# Patient Record
Sex: Male | Born: 1960 | Race: White | Hispanic: No | Marital: Married | Smoking: Never smoker
Health system: Southern US, Community
[De-identification: ages and names within clinical notes are randomized; demographics above are authoritative.]

## PROBLEM LIST (undated history)

## (undated) DIAGNOSIS — D126 Benign neoplasm of colon, unspecified: Secondary | ICD-10-CM

## (undated) DIAGNOSIS — G4700 Insomnia, unspecified: Secondary | ICD-10-CM

## (undated) DIAGNOSIS — K429 Umbilical hernia without obstruction or gangrene: Secondary | ICD-10-CM

## (undated) DIAGNOSIS — F419 Anxiety disorder, unspecified: Secondary | ICD-10-CM

## (undated) DIAGNOSIS — L600 Ingrowing nail: Secondary | ICD-10-CM

## (undated) HISTORY — DX: Benign neoplasm of colon, unspecified: D12.6

## (undated) HISTORY — DX: Ingrowing nail: L60.0

## (undated) HISTORY — DX: Insomnia, unspecified: G47.00

## (undated) HISTORY — DX: Umbilical hernia without obstruction or gangrene: K42.9

## (undated) HISTORY — DX: Anxiety disorder, unspecified: F41.9

---

## 1986-06-26 HISTORY — PX: HERNIA REPAIR: SHX51

## 1999-06-27 HISTORY — PX: HERNIA REPAIR: SHX51

## 1999-07-26 ENCOUNTER — Ambulatory Visit (HOSPITAL_BASED_OUTPATIENT_CLINIC_OR_DEPARTMENT_OTHER): Admission: RE | Admit: 1999-07-26 | Discharge: 1999-07-26 | Payer: Self-pay | Admitting: Surgery

## 2004-06-26 HISTORY — PX: HERNIA REPAIR: SHX51

## 2005-03-16 ENCOUNTER — Ambulatory Visit (HOSPITAL_COMMUNITY): Admission: RE | Admit: 2005-03-16 | Discharge: 2005-03-16 | Payer: Self-pay | Admitting: Surgery

## 2006-03-26 ENCOUNTER — Encounter: Admission: RE | Admit: 2006-03-26 | Discharge: 2006-03-26 | Payer: Self-pay | Admitting: Emergency Medicine

## 2006-06-09 ENCOUNTER — Encounter: Admission: RE | Admit: 2006-06-09 | Discharge: 2006-06-09 | Payer: Self-pay | Admitting: Emergency Medicine

## 2008-07-29 ENCOUNTER — Encounter: Admission: RE | Admit: 2008-07-29 | Discharge: 2008-07-29 | Payer: Self-pay | Admitting: Surgery

## 2010-11-11 NOTE — Op Note (Signed)
Ronald Cochran, Ronald Cochran                ACCOUNT NO.:  [id number]   MEDICAL RECORD NO.:  [id number]          PATIENT TYPE:  AMB   LOCATION:  DAY                          FACILITY:  [facility]   PHYSICIAN:  Wilmon Arms. Corliss Skains, M.D. DATE OF BIRTH:  09-27-1960   DATE OF PROCEDURE:  03/16/2005  DATE OF DISCHARGE:                                 OPERATIVE REPORT   PREOPERATIVE DIAGNOSIS:  Umbilical hernia.   POSTOPERATIVE DIAGNOSIS:  Umbilical hernia.   PROCEDURE PERFORMED:  Umbilical hernia repair.   SURGEON:  Wilmon Arms. Corliss Skains, M.D.   ASSISTANT:  Adolph Pollack, M.D.   ANESTHESIA:  General endotracheal.   INDICATIONS:  Patient is a 50 year old male in excellent health who has had  two previous inguinal hernia repairs.  The patient has noticed some  umbilical discomfort and a bulge.  On examination, he was noted to have a  herniated defect with some incarcerated omentum.  The decision was made to  proceed with the repair.   DESCRIPTION OF PROCEDURE:  Patient was brought to the operating room and  placed in a supine position on the operating room table.  He had been given  preoperative antibiotics.  After an adequate level of general endotracheal  anesthesia was obtained, the patient's periumbilical area was shaved,  prepped with Betadine, and draped in a sterile fashion.  A time out was then  taken to insure the proper patient and proper procedure.  A 3 cm incision  was made just above the umbilicus.  Dissection was carried down through the  subcutaneous tissues with Bovie cautery.  The hernia sac was identified.  This was densely adherent to the base of the umbilicus.  This was dissected  away from the undersurface of the umbilicus down to the fascia.  The fascial  defect was less than 1 cm in size.  The hernia sac was opened and appeared  to contain only a small piece of omentum.  This was reduced down to the  peritoneal cavity.  The sac was mobilized for approximately 2 cm in all  directions.  The decision was made to do a primary repair with suture.  A 0  Surgilon suture was then used to close the defect.  Four interrupted sutures  were used.  The wound was thoroughly irrigated.  A total of 16 cc of 0.25%  Marcaine were used to infiltrate the area, 3-0 Vicryl was used to tack down  the stalk of the umbilicus to the fascia, and 4-0 Monocryl was used to close  the skin.  Steri-Strips and a clean dressing were applied.  The patient was  extubated and brought to the recovery room in stable condition.      Wilmon Arms. Tsuei, M.D.  Electronically Signed     MKT/MEDQ  D:  03/16/2005  T:  03/16/2005  Job:  [id number]

## 2011-06-16 ENCOUNTER — Ambulatory Visit (HOSPITAL_COMMUNITY)
Admission: RE | Admit: 2011-06-16 | Discharge: 2011-06-16 | Disposition: A | Discharge status: Home / Self Care | Payer: BC Managed Care – PPO | Source: Ambulatory Visit | Attending: Family Medicine | Admitting: Family Medicine

## 2011-06-16 ENCOUNTER — Other Ambulatory Visit (HOSPITAL_COMMUNITY): Payer: Self-pay | Admitting: Family Medicine

## 2011-06-16 DIAGNOSIS — R1031 Right lower quadrant pain: Secondary | ICD-10-CM | POA: Insufficient documentation

## 2011-06-16 DIAGNOSIS — K37 Unspecified appendicitis: Secondary | ICD-10-CM

## 2011-06-16 DIAGNOSIS — K429 Umbilical hernia without obstruction or gangrene: Secondary | ICD-10-CM | POA: Insufficient documentation

## 2011-06-16 MED ORDER — IOHEXOL 300 MG/ML  SOLN
100.0000 mL | Freq: Once | INTRAMUSCULAR | Status: AC | PRN
  Administered 2011-06-16: 100 mL via INTRAVENOUS

## 2011-09-26 ENCOUNTER — Encounter (INDEPENDENT_AMBULATORY_CARE_PROVIDER_SITE_OTHER): Payer: Self-pay | Admitting: Surgery

## 2011-10-17 ENCOUNTER — Encounter (INDEPENDENT_AMBULATORY_CARE_PROVIDER_SITE_OTHER): Payer: Self-pay | Admitting: Surgery

## 2011-10-19 ENCOUNTER — Encounter (INDEPENDENT_AMBULATORY_CARE_PROVIDER_SITE_OTHER): Payer: Self-pay | Admitting: Surgery

## 2011-10-19 ENCOUNTER — Ambulatory Visit (INDEPENDENT_AMBULATORY_CARE_PROVIDER_SITE_OTHER): Payer: BC Managed Care – PPO | Admitting: Surgery

## 2011-10-19 VITALS — BP 118/80 | HR 64 | Temp 98.0°F | Resp 16 | Ht 69.0 in | Wt 199.0 lb

## 2011-10-19 DIAGNOSIS — K429 Umbilical hernia without obstruction or gangrene: Secondary | ICD-10-CM | POA: Insufficient documentation

## 2011-10-19 NOTE — Progress Notes (Signed)
Patient ID: Ronald Cochran, male   DOB: 1960/10/20, 51 y.o.   MRN: [id number]  Chief Complaint  Patient presents with  . Umbilical Hernia    HPI Ronald Cochran is a 51 y.o. male.  Referred by Dr. Docia Chuck HPI 51 yo male s/p open bilateral inguinal hernia repairs in the past, also s/p umbilical hernia repair in 2006.  He had a suture repair of a very small hernia defect.  Last December, he had an episode of severe umbilical pain and swelling.   He had a CT scan that showed an umbilical hernia containing fat.  The hernia was reduced and the patient only has intermittent discomfort at this time. Past Medical History  Diagnosis Date  . Anxiety   . Insomnia   . Tubular adenoma of colon   . Ingrowing nail     Past Surgical History  Procedure Date  . Umbilical hernia repair   . Hernia repair 1988    right inguinal hernia  . Hernia repair 2001    left inguinal hernia  . Hernia repair 2006    umbilical hernia    Family History  Problem Relation Age of Onset  . Heart disease Father   . Cancer Maternal Aunt     ovarian  . Cancer Maternal Grandmother     ovarian    Social History History  Substance Use Topics  . Smoking status: Never Smoker   . Smokeless tobacco: Not on file  . Alcohol Use: Yes    No Known Allergies  Current Outpatient Prescriptions  Medication Sig Dispense Refill  . fish oil-omega-3 fatty acids 1000 MG capsule Take 2 g by mouth daily.      Marland Kitchen loratadine (CLARITIN) 10 MG tablet Take 10 mg by mouth daily.      . Multiple Vitamins-Minerals (MENS MULTI VITAMIN & MINERAL PO) Take by mouth.        Review of Systems Review of Systems  Constitutional: Negative for fever, chills and unexpected weight change.  HENT: Negative for hearing loss, congestion, sore throat, trouble swallowing and voice change.   Eyes: Negative for visual disturbance.  Respiratory: Negative for cough and wheezing.   Cardiovascular: Negative for chest pain, palpitations and leg swelling.    Gastrointestinal: Positive for abdominal pain. Negative for nausea, vomiting, diarrhea, constipation, blood in stool, abdominal distention, anal bleeding and rectal pain.  Genitourinary: Negative for hematuria and difficulty urinating.  Musculoskeletal: Negative for arthralgias.  Skin: Negative for rash and wound.  Neurological: Negative for seizures, syncope, weakness and headaches.  Hematological: Negative for adenopathy. Does not bruise/bleed easily.  Psychiatric/Behavioral: Negative for confusion.    Blood pressure 118/80, pulse 64, temperature 98 F (36.7 C), temperature source Temporal, resp. rate 16, height 5\' 9"  (1.753 m), weight 199 lb (90.266 kg).  Physical Exam Physical Exam WDWN in NAD HEENT:  EOMI, sclera anicteric Neck:  No masses, no thyromegaly Lungs:  CTA bilaterally; normal respiratory effort CV:  Regular rate and rhythm; no murmurs Abd:  +bowel sounds, soft, non-tender, small protruding, partially reducible hernia at lower umbilicus GU:  No sign of recurrent hernia on either side Ext:  Well-perfused; no edema Skin:  Warm, dry; no sign of jaundice  Data Reviewed CT scan  Assessment    Recurrent umbilical hernia    Plan    Umbilical hernia repair with mesh.  The surgical procedure has been discussed with the patient.  Potential risks, benefits, alternative treatments, and expected outcomes have been explained.  All of  the patient's questions at this time have been answered.  The likelihood of reaching the patient's treatment goal is good.  The patient understand the proposed surgical procedure and wishes to proceed.        Amberlee Garvey K. 10/19/2011, 9:48 AM

## 2011-12-06 DIAGNOSIS — K429 Umbilical hernia without obstruction or gangrene: Secondary | ICD-10-CM

## 2011-12-06 HISTORY — PX: UMBILICAL HERNIA REPAIR: SHX196

## 2011-12-07 ENCOUNTER — Telehealth (INDEPENDENT_AMBULATORY_CARE_PROVIDER_SITE_OTHER): Payer: Self-pay | Admitting: General Surgery

## 2011-12-07 NOTE — Telephone Encounter (Signed)
MR Cass CALLED TO ASK IF IT WOULD BE OK FOR HIM TO SEE HIS CHIROPRACTOR NEXT WEEK AND HAVE A BACK MASSAGE. HE WOULD BE PLACED ON HIS ABDOMEN. I REVIEWED THIS WITH DR. Corliss Skains AND HE SAID IT WOULD BE OK/ PT NOTIFIED/GY/ I ALSO TOLD PT IT WOULD DEPEND ON HIS COMFORT LEVEL WHEN ON ABDOMEN/GY

## 2012-01-09 ENCOUNTER — Encounter (INDEPENDENT_AMBULATORY_CARE_PROVIDER_SITE_OTHER): Payer: Self-pay | Admitting: Surgery

## 2012-01-09 ENCOUNTER — Ambulatory Visit (INDEPENDENT_AMBULATORY_CARE_PROVIDER_SITE_OTHER): Payer: BC Managed Care – PPO | Admitting: Surgery

## 2012-01-09 VITALS — BP 128/80 | HR 70 | Temp 97.4°F | Resp 16 | Ht 69.0 in | Wt 196.2 lb

## 2012-01-09 DIAGNOSIS — K429 Umbilical hernia without obstruction or gangrene: Secondary | ICD-10-CM

## 2012-01-09 NOTE — Progress Notes (Signed)
S/p open repair of an umbilical hernia with mesh on 12/06/11.  The patient is doing quite well.  He recently had his wisdom teeth removed, which distracted him from any umbilical pain.  His incision is well-healed with no sign of infection or recurrent hernia.  He may resume full activity.  Follow-up PRN.  Wilmon Arms. Corliss Skains, MD, [facility]  01/09/2012 9:44 AM

## 2012-06-03 ENCOUNTER — Ambulatory Visit
Admission: RE | Admit: 2012-06-03 | Discharge: 2012-06-03 | Disposition: A | Discharge status: Home / Self Care | Payer: BC Managed Care – PPO | Source: Ambulatory Visit | Attending: Family Medicine | Admitting: Family Medicine

## 2012-06-03 ENCOUNTER — Other Ambulatory Visit: Payer: Self-pay | Admitting: Family Medicine

## 2012-06-03 DIAGNOSIS — M25472 Effusion, left ankle: Secondary | ICD-10-CM

## 2013-01-13 ENCOUNTER — Encounter (INDEPENDENT_AMBULATORY_CARE_PROVIDER_SITE_OTHER): Payer: Self-pay | Admitting: Surgery

## 2013-01-17 ENCOUNTER — Encounter (INDEPENDENT_AMBULATORY_CARE_PROVIDER_SITE_OTHER): Payer: Self-pay | Admitting: General Surgery

## 2013-01-17 ENCOUNTER — Ambulatory Visit (INDEPENDENT_AMBULATORY_CARE_PROVIDER_SITE_OTHER): Payer: BC Managed Care – PPO | Admitting: General Surgery

## 2013-01-17 ENCOUNTER — Other Ambulatory Visit (HOSPITAL_COMMUNITY): Payer: Self-pay | Admitting: Chiropractic Medicine

## 2013-01-17 ENCOUNTER — Ambulatory Visit (HOSPITAL_COMMUNITY)
Admission: RE | Admit: 2013-01-17 | Discharge: 2013-01-17 | Disposition: A | Discharge status: Home / Self Care | Payer: BC Managed Care – PPO | Source: Ambulatory Visit | Attending: Chiropractic Medicine | Admitting: Chiropractic Medicine

## 2013-01-17 VITALS — BP 132/80 | HR 56 | Resp 14 | Ht 69.0 in | Wt 188.6 lb

## 2013-01-17 DIAGNOSIS — M542 Cervicalgia: Secondary | ICD-10-CM

## 2013-01-17 DIAGNOSIS — D179 Benign lipomatous neoplasm, unspecified: Secondary | ICD-10-CM

## 2013-01-17 DIAGNOSIS — M25512 Pain in left shoulder: Secondary | ICD-10-CM

## 2013-01-17 DIAGNOSIS — M25519 Pain in unspecified shoulder: Secondary | ICD-10-CM | POA: Insufficient documentation

## 2013-01-17 NOTE — Progress Notes (Signed)
Patient ID: Ronald Cochran, male   DOB: 06/15/1961, 52 y.o.   MRN: [id number]  Chief Complaint  Patient presents with  . New Evaluation    eval lipoma on back    HPI Ronald Cochran is a 52 y.o. male.  The patient is a 52 year old male who is referred by Dr. Docia Chuck for an evaluation of the left shoulder lipoma. They state this year for many years but has gotten bigger and causing more pain. The bases of these had some neuropathic pain that radiates to his left chest wall. HPI  Past Medical History  Diagnosis Date  . Anxiety   . Insomnia   . Tubular adenoma of colon   . Ingrowing nail   . Umbilical hernia     Past Surgical History  Procedure Laterality Date  . Umbilical hernia repair  12/06/11    with mesh  . Hernia repair  1988    right inguinal hernia  . Hernia repair  2001    left inguinal hernia  . Hernia repair  2006    umbilical hernia    Family History  Problem Relation Age of Onset  . Heart disease Father   . Cancer Maternal Aunt     ovarian  . Cancer Maternal Grandmother     ovarian    Social History History  Substance Use Topics  . Smoking status: Never Smoker   . Smokeless tobacco: Not on file  . Alcohol Use: Yes    No Known Allergies  Current Outpatient Prescriptions  Medication Sig Dispense Refill  . fish oil-omega-3 fatty acids 1000 MG capsule Take 2 g by mouth daily.      Marland Kitchen loratadine (CLARITIN) 10 MG tablet Take 10 mg by mouth as needed.       . Multiple Vitamins-Minerals (MENS MULTI VITAMIN & MINERAL PO) Take by mouth.       No current facility-administered medications for this visit.    Review of Systems Review of Systems  Constitutional: Negative.   Eyes: Negative.   Respiratory: Negative.   Cardiovascular: Negative.   Gastrointestinal: Negative.   Neurological: Negative.   All other systems reviewed and are negative.    Blood pressure 132/80, pulse 56, resp. rate 14, height 5\' 9"  (1.753 m), weight 188 lb 9.6 oz (85.548  kg).  Physical Exam Physical Exam  Constitutional: He is oriented to person, place, and time. He appears well-developed and well-nourished.  HENT:  Head: Normocephalic and atraumatic.  Eyes: Conjunctivae and EOM are normal. Pupils are equal, round, and reactive to light.  Neck: Normal range of motion. Neck supple.  Cardiovascular: Normal rate, regular rhythm and normal heart sounds.   Pulmonary/Chest: Effort normal and breath sounds normal.    Abdominal: Bowel sounds are normal.  Musculoskeletal: Normal range of motion.  Neurological: He is alert and oriented to person, place, and time.    Data Reviewed none  Assessment    This 52 year old male with a left posterior shoulder lipoma     Plan    1. We'll proceed to the operating room for an excision of the left posterior shoulder lipoma. 2. Discussed with him the possible risks of surgery to include infection, bleeding, damage to other structures, possible recurrence, and possible seroma formation. The patient voiced understanding and wished to proceed with the procedure.       Marigene Ehlers., Montoya Watkin 01/17/2013, 2:37 PM

## 2013-01-20 ENCOUNTER — Encounter (INDEPENDENT_AMBULATORY_CARE_PROVIDER_SITE_OTHER): Payer: BC Managed Care – PPO | Admitting: General Surgery

## 2013-02-04 ENCOUNTER — Encounter (INDEPENDENT_AMBULATORY_CARE_PROVIDER_SITE_OTHER): Payer: BC Managed Care – PPO | Admitting: Surgery

## 2013-02-12 ENCOUNTER — Other Ambulatory Visit (INDEPENDENT_AMBULATORY_CARE_PROVIDER_SITE_OTHER): Payer: Self-pay | Admitting: *Deleted

## 2013-02-12 ENCOUNTER — Other Ambulatory Visit (INDEPENDENT_AMBULATORY_CARE_PROVIDER_SITE_OTHER): Payer: Self-pay | Admitting: General Surgery

## 2013-02-12 DIAGNOSIS — D1739 Benign lipomatous neoplasm of skin and subcutaneous tissue of other sites: Secondary | ICD-10-CM

## 2013-02-12 MED ORDER — OXYCODONE-ACETAMINOPHEN 5-325 MG PO TABS: 1.0000 | ORAL_TABLET | ORAL | Status: DC | PRN

## 2013-02-26 ENCOUNTER — Telehealth (INDEPENDENT_AMBULATORY_CARE_PROVIDER_SITE_OTHER): Payer: Self-pay | Admitting: General Surgery

## 2013-02-26 NOTE — Telephone Encounter (Signed)
LMOM @10 :45 asking patient to be here at 3:00 for Friday 9/5 appt...pt suppose to call back to let us know if 3 is a good time.Marland KitchenMarland KitchenIf not then 4:30 is his arrival time

## 2013-02-28 ENCOUNTER — Encounter (INDEPENDENT_AMBULATORY_CARE_PROVIDER_SITE_OTHER): Payer: Self-pay | Admitting: General Surgery

## 2013-02-28 ENCOUNTER — Ambulatory Visit (INDEPENDENT_AMBULATORY_CARE_PROVIDER_SITE_OTHER): Payer: BC Managed Care – PPO | Admitting: General Surgery

## 2013-02-28 VITALS — BP 112/68 | HR 62 | Resp 14 | Ht 69.0 in | Wt 190.6 lb

## 2013-02-28 DIAGNOSIS — Z86018 Personal history of other benign neoplasm: Secondary | ICD-10-CM

## 2013-02-28 DIAGNOSIS — Z9889 Other specified postprocedural states: Secondary | ICD-10-CM

## 2013-02-28 NOTE — Progress Notes (Signed)
Patient ID: Ronald Cochran, male   DOB: Apr 03, 1961, 52 y.o.   MRN: [id number] The patient is a 52 year old male status post left scapular lipoma removal. The patient has been doing fine up until yesterday when he noticed a bump return underneath the incision site. The patient states he is started weightlifting and some burning. The patient does state that it was slightly tender in that area. The patient had no signs or symptoms of infection or fever.  On exam: Patient has a well-healed incision. Fluctuant fluid collection underneath the incision site.  Assessment and plan: 1. I proceeded to aspirate the fluid collection which turned out to be a hematoma. Approximately 25 cc were removed.  His stitcheswere removed. I discussed with the patient the signs and symptoms of possible infection to area he calls back any erythema or increased pain come up. 2. The patient follow up as needed.

## 2013-03-04 ENCOUNTER — Ambulatory Visit (INDEPENDENT_AMBULATORY_CARE_PROVIDER_SITE_OTHER): Payer: BC Managed Care – PPO | Admitting: Surgery

## 2013-03-04 ENCOUNTER — Encounter (INDEPENDENT_AMBULATORY_CARE_PROVIDER_SITE_OTHER): Payer: Self-pay | Admitting: Surgery

## 2013-03-04 VITALS — BP 116/70 | HR 64 | Temp 98.0°F | Resp 14 | Ht 69.0 in | Wt 190.8 lb

## 2013-03-04 DIAGNOSIS — IMO0002 Reserved for concepts with insufficient information to code with codable children: Secondary | ICD-10-CM | POA: Insufficient documentation

## 2013-03-04 DIAGNOSIS — T889XXS Complication of surgical and medical care, unspecified, sequela: Secondary | ICD-10-CM

## 2013-03-04 DIAGNOSIS — T888XXS Other specified complications of surgical and medical care, not elsewhere classified, sequela: Secondary | ICD-10-CM

## 2013-03-04 DIAGNOSIS — D171 Benign lipomatous neoplasm of skin and subcutaneous tissue of trunk: Secondary | ICD-10-CM | POA: Insufficient documentation

## 2013-03-04 DIAGNOSIS — D1779 Benign lipomatous neoplasm of other sites: Secondary | ICD-10-CM

## 2013-03-04 NOTE — Progress Notes (Signed)
URGENT OFFICE  The patient is status post excision of a left shoulder lipoma on 02/12/13. This was performed by Dr. Derrell Lolling. This lipoma was about 8 cm across. He did not have a drain in place. About 2 weeks after surgery the patient developed swelling under his incision. This area was aspirated 4 days ago by Dr. Derrell Lolling. Approximately 25 cc of old bloody fluid was aspirated. The patient states that he felt much better. However over the last few days this seems to have reaccumulated and is fairly sore.  On examination there is no sign of infection. His incision is well-healed. He does seem to have some fluid under his wound. The wound is not tense. We prepped the skin with alcohol and anesthetized with 1% lidocaine. I aspirated about 8 cc of old bloody fluid from the lipoma cavity. We placed a Band-Aid over the puncture site. The patient will keep ice pack over this area for the next couple of days and we'll decrease his workout regimen that involves his shoulders. We will have him follow up with Dr. Derrell Lolling in one week.  Ronald Cochran. Corliss Skains, MD, [facility]  General/ Trauma Surgery  03/04/2013 3:30 PM

## 2013-03-11 ENCOUNTER — Encounter (INDEPENDENT_AMBULATORY_CARE_PROVIDER_SITE_OTHER): Payer: Self-pay | Admitting: General Surgery

## 2013-03-11 ENCOUNTER — Ambulatory Visit (INDEPENDENT_AMBULATORY_CARE_PROVIDER_SITE_OTHER): Payer: BC Managed Care – PPO | Admitting: General Surgery

## 2013-03-11 VITALS — BP 118/74 | HR 68 | Resp 16 | Ht 69.0 in | Wt 192.6 lb

## 2013-03-11 DIAGNOSIS — Z9889 Other specified postprocedural states: Secondary | ICD-10-CM

## 2013-03-11 NOTE — Progress Notes (Signed)
Patient ID: Ronald Cochran, male   DOB: 1960-11-07, 52 y.o.   MRN: [id number]  The patient is a 52 year old male status post left lipoma excision. The patient has had some liquefied hematoma that has been aspirated x2. The patient complaints of some pain in the inferior portion of his wound.  On exam: His wound is clean dry and intact. He does have a small amount of fluid in the inferior portion of the wound. There is a minimal bruising in the superior portion of the wound that seems superficial.  Assessment and plan: 1. We'll have patient follow back up in 2 weeks 4 wound check. 2. The patient in followup prior to these had any further issues.

## 2013-03-28 ENCOUNTER — Encounter (INDEPENDENT_AMBULATORY_CARE_PROVIDER_SITE_OTHER): Payer: BC Managed Care – PPO | Admitting: General Surgery

## 2013-04-01 ENCOUNTER — Encounter (INDEPENDENT_AMBULATORY_CARE_PROVIDER_SITE_OTHER): Payer: Self-pay | Admitting: General Surgery

## 2013-04-01 ENCOUNTER — Ambulatory Visit (INDEPENDENT_AMBULATORY_CARE_PROVIDER_SITE_OTHER): Payer: BC Managed Care – PPO | Admitting: General Surgery

## 2013-04-01 VITALS — BP 126/76 | HR 68 | Resp 18 | Ht 69.0 in | Wt 193.6 lb

## 2013-04-01 DIAGNOSIS — Z9889 Other specified postprocedural states: Secondary | ICD-10-CM

## 2013-04-01 NOTE — Progress Notes (Signed)
Patient ID: Ronald Cochran, male   DOB: 11-09-1960, 52 y.o.   MRN: [id number] The patient is a 52 year old male status post back lipoma removal. The patient has been doing well since his last aspiration of this liquefied hematoma. He has no pain at this time.  On exam: wound is clean dry and intact  There is a small mobile Intact hematoma that feels approximately jellybean in size be incised.  The patient is a 52 year old male status post lipoma removal and hematoma. Patient in followup as needed

## 2013-05-01 ENCOUNTER — Other Ambulatory Visit: Payer: Self-pay

## 2013-08-05 ENCOUNTER — Ambulatory Visit (HOSPITAL_BASED_OUTPATIENT_CLINIC_OR_DEPARTMENT_OTHER): Payer: BC Managed Care – PPO | Attending: Otolaryngology

## 2013-08-05 VITALS — Ht 69.0 in | Wt 193.0 lb

## 2013-08-05 DIAGNOSIS — Z9989 Dependence on other enabling machines and devices: Secondary | ICD-10-CM

## 2013-08-05 DIAGNOSIS — G4733 Obstructive sleep apnea (adult) (pediatric): Secondary | ICD-10-CM

## 2013-08-09 DIAGNOSIS — G4733 Obstructive sleep apnea (adult) (pediatric): Secondary | ICD-10-CM

## 2013-08-09 NOTE — Sleep Study (Signed)
   NAME: Ronald Cochran DATE OF BIRTH:  1961-05-21 MEDICAL RECORD NUMBER [id number]  LOCATION: [facility]  PHYSICIAN: YOUNG,CLINTON D  DATE OF STUDY: 08/05/2013  SLEEP STUDY TYPE: Nocturnal Polysomnogram               REFERRING PHYSICIAN: Melida Quitter, MD  INDICATION FOR STUDY: Hypersomnia with sleep apnea  EPWORTH SLEEPINESS SCORE:   9/24 HEIGHT: 5\' 9"  (175.3 cm)  WEIGHT: 193 lb (87.544 kg)    Body mass index is 28.49 kg/(m^2).  NECK SIZE: 16.5 in.  MEDICATIONS: Charted for review  SLEEP ARCHITECTURE: Split study protocol. During the diagnostic phase, total sleep time 120.5 minutes with sleep efficiency 88%. Stage I was 8.7%, stage II 75.5%, stage III 0.8%, REM 14.9% of total sleep time. Sleep latency 10 minutes, REM latency 56.5 minutes, awake after sleep onset 6.5 minutes, arousal index 21.4. Bedtime medication: None.  RESPIRATORY DATA: Apnea hypopnea index (AHI) 25.4 per hour. 51 events scored including 6 obstructive apneas and 45 hypopneas. Events were not positional. REM AHI 20 per hour. CPAP titrated to 7 CWP, AHI 0 per hour. He wore a [employer] Simplus FFM, size large, with heated humidifier.  OXYGEN DATA: Moderately loud snoring before CPAP with oxygen desaturation to a nadir of 87% on room air. With CPAP control, snoring was prevented and mean oxygen saturation of 93.8% on room air.  CARDIAC DATA: Normal sinus rhythm  MOVEMENT/PARASOMNIA: A few incidental limb jerks were noted with little effect on sleep. Bathroom x1  IMPRESSION/ RECOMMENDATION:   1) Moderate obstructive sleep apnea/hypopnea syndrome, AHI 25.4 per hour with non-positional events. Moderately loud snoring with oxygen desaturation to a nadir of 87% on room air.  2) Successful CPAP titration to 7 CWP, AHI 0 per hour. He wore a large [employer] Simplus mask with heated humidifier. Snoring was prevented and mean oxygen saturation held at 93.8% on room air   Signed Baird Lyons  M.D. Deneise Lever Diplomate, American Board of Sleep Medicine  ELECTRONICALLY SIGNED ON:  08/09/2013, 11:40 AM [place] PH: [phone]   FX: [phone] [place]

## 2013-12-06 ENCOUNTER — Emergency Department (HOSPITAL_COMMUNITY)
Admission: EM | Admit: 2013-12-06 | Discharge: 2013-12-06 | Disposition: A | Discharge status: Home / Self Care | Payer: BC Managed Care – PPO | Attending: Emergency Medicine | Admitting: Emergency Medicine

## 2013-12-06 ENCOUNTER — Encounter (HOSPITAL_COMMUNITY): Payer: Self-pay | Admitting: Emergency Medicine

## 2013-12-06 ENCOUNTER — Emergency Department (HOSPITAL_COMMUNITY): Payer: BC Managed Care – PPO

## 2013-12-06 DIAGNOSIS — Y92009 Unspecified place in unspecified non-institutional (private) residence as the place of occurrence of the external cause: Secondary | ICD-10-CM | POA: Insufficient documentation

## 2013-12-06 DIAGNOSIS — W260XXA Contact with knife, initial encounter: Secondary | ICD-10-CM | POA: Insufficient documentation

## 2013-12-06 DIAGNOSIS — Y9389 Activity, other specified: Secondary | ICD-10-CM | POA: Insufficient documentation

## 2013-12-06 DIAGNOSIS — Z23 Encounter for immunization: Secondary | ICD-10-CM | POA: Insufficient documentation

## 2013-12-06 DIAGNOSIS — Z79899 Other long term (current) drug therapy: Secondary | ICD-10-CM | POA: Insufficient documentation

## 2013-12-06 DIAGNOSIS — Z8659 Personal history of other mental and behavioral disorders: Secondary | ICD-10-CM | POA: Insufficient documentation

## 2013-12-06 DIAGNOSIS — W261XXA Contact with sword or dagger, initial encounter: Secondary | ICD-10-CM

## 2013-12-06 DIAGNOSIS — IMO0002 Reserved for concepts with insufficient information to code with codable children: Secondary | ICD-10-CM | POA: Insufficient documentation

## 2013-12-06 DIAGNOSIS — Z872 Personal history of diseases of the skin and subcutaneous tissue: Secondary | ICD-10-CM | POA: Insufficient documentation

## 2013-12-06 DIAGNOSIS — Z8719 Personal history of other diseases of the digestive system: Secondary | ICD-10-CM | POA: Insufficient documentation

## 2013-12-06 DIAGNOSIS — Z791 Long term (current) use of non-steroidal anti-inflammatories (NSAID): Secondary | ICD-10-CM | POA: Insufficient documentation

## 2013-12-06 DIAGNOSIS — S6980XA Other specified injuries of unspecified wrist, hand and finger(s), initial encounter: Secondary | ICD-10-CM | POA: Insufficient documentation

## 2013-12-06 DIAGNOSIS — S6990XA Unspecified injury of unspecified wrist, hand and finger(s), initial encounter: Principal | ICD-10-CM | POA: Insufficient documentation

## 2013-12-06 DIAGNOSIS — S6992XA Unspecified injury of left wrist, hand and finger(s), initial encounter: Secondary | ICD-10-CM

## 2013-12-06 MED ORDER — HYDROCODONE-ACETAMINOPHEN 5-325 MG PO TABS: 1.0000 | ORAL_TABLET | ORAL | Status: AC | PRN

## 2013-12-06 MED ORDER — TETANUS-DIPHTH-ACELL PERTUSSIS 5-2.5-18.5 LF-MCG/0.5 IM SUSP
0.5000 mL | Freq: Once | INTRAMUSCULAR | Status: AC
  Administered 2013-12-06: 0.5 mL via INTRAMUSCULAR
  Filled 2013-12-06: qty 0.5

## 2013-12-06 MED ORDER — OXYCODONE-ACETAMINOPHEN 5-325 MG PO TABS
1.0000 | ORAL_TABLET | Freq: Once | ORAL | Status: AC
  Administered 2013-12-06: 1 via ORAL
  Filled 2013-12-06: qty 1

## 2013-12-06 MED ORDER — GELATIN ABSORBABLE 12-7 MM EX MISC
1.0000 | Freq: Once | CUTANEOUS | Status: AC
  Administered 2013-12-06: 1 via TOPICAL

## 2013-12-06 MED ORDER — IBUPROFEN 800 MG PO TABS: 800.0000 mg | ORAL_TABLET | Freq: Three times a day (TID) | ORAL | Status: AC

## 2013-12-06 NOTE — ED Notes (Signed)
Patient returned from X-ray 

## 2013-12-06 NOTE — ED Notes (Signed)
See Wound Note.

## 2013-12-06 NOTE — ED Notes (Addendum)
Pt was cooking, cutting vegetables, cut nail bed of right pinky finger. Clotted, not bleeding at this time. Came immediately to ED. Tingling/numbness to site

## 2013-12-06 NOTE — Discharge Instructions (Signed)
Call for a follow up appointment with a Family or Primary Care Provider.  Return if Symptoms worsen.   Take medication as prescribed.  Keep hand elevated above your heart. Ice your finger. Change your dressing in 1 day.  The Surgifoam should absorb over night. Keep the wound clean and dry.    Emergency Department Resource Guide 1) Find a Doctor and Pay Out of Pocket Although you won't have to find out who is covered by your insurance plan, it is a good idea to ask around and get recommendations. You will then need to call the office and see if the doctor you have chosen will accept you as a new patient and what types of options they offer for patients who are self-pay. Some doctors offer discounts or will set up payment plans for their patients who do not have insurance, but you will need to ask so you aren't surprised when you get to your appointment.  2) Contact Your Local Health Department Not all health departments have doctors that can see patients for sick visits, but many do, so it is worth a call to see if yours does. If you don't know where your local health department is, you can check in your phone book. The CDC also has a tool to help you locate your state's health department, and many state websites also have listings of all of their local health departments.  3) Find a [facility] If your illness is not likely to be very severe or complicated, you may want to try a walk in clinic. These are popping up all over the country in pharmacies, drugstores, and shopping centers. They're usually staffed by nurse practitioners or physician assistants that have been trained to treat common illnesses and complaints. They're usually fairly quick and inexpensive. However, if you have serious medical issues or chronic medical problems, these are probably not your best option.  No Primary Care Doctor: - Call Health Connect at  [phone] - they can help you locate a primary care doctor that  accepts  your insurance, provides certain services, etc. - Physician Referral Service- [phone]  Chronic Pain Problems: Organization         Address  Phone   Notes  [facility]  [phone] Patients need to be referred by their primary care doctor.   Medication Assistance: Organization         Address  Phone   Notes  [facility] Medication [facility] [place] [place], [place] [address] [phone] --Must be a resident of [facility] -- Must have NO insurance coverage whatsoever (no Medicaid/ Medicare, etc.) -- The pt. MUST have a primary care doctor that directs their care regularly and follows them in the community   MedAssist  [phone]   [employer]  [phone]    Agencies that provide inexpensive medical care: Organization         Address  Phone   Notes  [place]  [phone]   [facility]    [phone]   [facility] [place], [place] [address] [phone]   [place] [address], [place] [phone]   Planned Parenthood    [phone]   [facility]    [phone]   [place] and [place], [place] Phone:  [phone], Fax:  [phone] Hours  of Operation:  9 am - 6 pm, M-F.  Also accepts Medicaid/Medicare and self-pay.  [facility] for [place], [address], [place] Phone: [phone], Fax: [phone]. Hours of Operation:  8:30 am - 5:30 pm, M-F.  Also accepts Medicaid and self-pay.  [facility] [place] [address], [place] Phone: [phone]   [place], [place], [place] [phone], Ext. [phone] Mondays & Thursdays: 7-9 AM.  First 15 patients are seen on a first come, first serve basis.    [place] Providers:  Organization          Address  Phone   Notes  [facility] [address],  [phone] Also accepts self-pay patients.  [facility] [address], [place]  [phone]   [place], [address], [place] [phone]   [facility] Family Medicine [address] [place] [phone]   Lucianne Lei [address], [place]   [phone] Only accepts [place] Access [place] patients after they have their name applied to their card.   Self-Pay (no insurance) in [facility]:  Organization         Address  Phone   Notes  Sickle Cell Patients, [facility] Internal Medicine [place] [phone]   [facility] [place] [phone]   [facility] [place]  [place], [place], [place] [phone]   [facility]/Dr. Osei-Bonsu  [address], [place] or [address], [place] [phone] Phone number for both [place] and [place] locations is the same.  [facility] [address] [place] [phone]   [facility] [address], [place] or [address] [phone]   [facility] [address] [place] [phone], phone; [phone], fax Sees patients 1st and 3rd Saturday of every month.  Must not qualify for public or private insurance (i.e. Medicaid, Medicare, [place] Health Choice, Veterans' Benefits)  Household income should be no more than 200% of the poverty level The clinic cannot treat you if you are pregnant or think you are pregnant  Sexually transmitted diseases are not treated at the clinic.    Dental Care: Organization         Address  Phone  Notes  [facility] Department of [facility] [phone] Accepts children up to age 64 who are enrolled in [place] or [place]; pregnant women with a Medicaid card; and children who have applied for Medicaid or [place] Health Choice, but were declined, whose parents can pay a reduced fee at time of service.  [facility] Department of [facility]  [address], [place] [phone] Accepts children up to age 2 who are enrolled in [place] or [place]; pregnant women with a Medicaid card; and children who have applied for Medicaid or [place] Health Choice, but were declined, whose parents can pay a reduced fee at time of service.  [place] Adult Dental Access PROGRAM  [place] [phone] Patients are seen by appointment only. Walk-ins are not accepted. [place] will see  patients 61 years of age and older. Monday - Tuesday (8am-5pm) Most Wednesdays (8:30-5pm) $30 per visit, cash only  [facility] Adult Dental Access PROGRAM  [address], [facility] [phone] Patients are seen by appointment only. Walk-ins are not accepted. [place] will see patients 29 years of age and older. One Wednesday Evening (Monthly: Volunteer Based).  $30 per visit, cash only  [place]  [phone] for adults; Children under age 71, call Graduate Pediatric Dentistry at [phone]. Children aged 36-14, please call [phone] to request a pediatric application.  Dental services are provided in all areas of dental care including fillings, crowns and bridges, complete and partial dentures, implants, gum treatment, root canals, and extractions. Preventive care is also provided. Treatment is provided to both adults and children. Patients are selected via a lottery and there is often a waiting list.   [facility] [address], [place]  [phone] [email or web address]   [facility] [address] [place], [place]  [phone], Ext. [phone] Second and Fourth Thursday of each month, opens at 6:30 AM; Clinic ends at 9 AM.  Patients are seen on a first-come first-served basis, and a limited number are seen during each clinic.   [facility]  [address] Hillard Danker [place], [place] [phone]   Eligibility Requirements You must have lived in [place], [place], or [place] counties for at least the last three months.   You cannot be eligible for state or federal sponsored [employer], including [employer], [place], or [employer].   You generally cannot be eligible for healthcare insurance through your employer.    How to apply: Eligibility screenings are held every Tuesday and Wednesday afternoon from 1:00 pm until 4:00 pm. You do not need an appointment for the interview!  [facility] [address] [place], [place]   [place]  [place] Department  [place]  [phone]    Behavioral Health Resources in the Community: Intensive Outpatient Programs Organization         Address  Phone  Notes  Leona Louin. [address] [place], [place] [phone]   [facility] Outpatient [address], [place], [place]   ADS: [facility] [address] [place], [place]   [place] [address]  [place], [place] or [phone]   Substance Abuse Resources Organization         Address  Phone  Notes  Alcohol and Drug Services  [phone]   [place]  [phone]   [facility] [place]   Chinita Pester  [phone]   Residential & Outpatient Substance Abuse Program  [phone]   Psychological Services Organization         Address  Phone  Notes  [facility]  [place]  [phone]    [facility] [address] [place] or [phone]    Mobile Crisis Teams Organization         Address  Phone  Notes  [facility], Mobile Crisis Care Unit  [phone]   [facility]  [address] [place], [place]   [facility] [address] [place] [phone]    Self-Help/Support Groups Organization  Address  Phone             Notes  [place]. of [place] - variety of support groups  [place] Call for more information  Narcotics Anonymous (NA), [facility] [address], [employer] [place]  2 meetings at this location   Special educational needs teacher         Address  Phone  Notes  [facility] Residential Treatment [place],    [place]  [phone]   [facility]  [address], [place] [phone], [place], [place]   [place], [place] [phone] Admissions: 8am-3pm M-F  Incentives Substance [place] [address]    [place], [place] [phone]   [facility] [address] [place], [place], [place]   [facility] [address],  [place], [place]   [facility] Programs - Intensive Outpatient [address] Kristeen [address], [place], [place]   [facility] [place].,  [place], [place] [phone] or [phone]   [facility] (RTS) [address], [place], [place] Accepts Medicaid  [facility] [place] [address],  [place] [phone] Substance Abuse/Addiction Treatment   [facility] Organization         Address  Phone  Notes  [facility]  [phone]   Domenic Schwab, PhD [address] Arlis Porta [place], [place]   [phone] or [phone]   [place]  [place], [place] [phone]   [facility] [address], [place], [place] [phone] Insurance/Medicaid/sponsorship through [facility] and Families [address], [address] [place]                                    [place], [place] [phone] [place] [address], [place] [phone]    Dr. Adele Schilder  [phone]   [facility] Dept. 1) [address] [place] 2) [place] 3)  [place] [address], [place] [phone]  [phone]   [place] [phone] or [phone] (After Hours)

## 2013-12-06 NOTE — ED Provider Notes (Signed)
CSN: [id number]     Arrival date & time 12/06/13  2004 History  This chart was scribed for non-physician practitioner Harvie Heck working with Wandra Arthurs, MD by Mercy Moore, ED Scribe. This patient was seen in room TR06C/TR06C and the patient's care was started at 9:54 PM.   Chief Complaint  Patient presents with  . Finger Injury     HPI Comments: Ronald Cochran is a 53 y.o. male who presents to the Emergency Department with right fifth finger injury that occurred prior to arrival. Patient reports cooking, cutting vegetables with mandolin and slicing off the tip of his right fifth finger. Patient reports pain at site. Denies other injury. Patient is uncertain of last Tetanus and is due for an update. Denies history of Diabetes.   The history is provided by the patient. No language interpreter was used.    Past Medical History  Diagnosis Date  . Anxiety   . Insomnia   . Tubular adenoma of colon   . Ingrowing nail   . Umbilical hernia    Past Surgical History  Procedure Laterality Date  . Umbilical hernia repair  12/06/11    with mesh  . Hernia repair  1988    right inguinal hernia  . Hernia repair  2001    left inguinal hernia  . Hernia repair  2542    umbilical hernia   Family History  Problem Relation Age of Onset  . Heart disease Father   . Cancer Maternal Aunt     ovarian  . Cancer Maternal Grandmother     ovarian   History  Substance Use Topics  . Smoking status: Never Smoker   . Smokeless tobacco: Never Used  . Alcohol Use: Yes     Comment: had some tequila PTA    Review of Systems  Constitutional: Negative for fever.  Skin: Positive for wound.       Laceration  All other systems reviewed and are negative.     Allergies  Review of patient's allergies indicates no known allergies.  Home Medications   Prior to Admission medications   Medication Sig Start Date End Date Taking? Authorizing Provider  aspirin EC 81 MG tablet Take 81 mg by mouth  daily.   Yes Historical Provider, MD  fish oil-omega-3 fatty acids 1000 MG capsule Take 2 g by mouth daily.   Yes Historical Provider, MD  loratadine (CLARITIN) 10 MG tablet Take 10 mg by mouth daily as needed for allergies.    Yes Historical Provider, MD  Misc Natural Products ([employer] GLUCOS-CHONDROIT-MSM DS PO) Take 1 tablet by mouth daily.   Yes Historical Provider, MD  Multiple Vitamins-Minerals (MENS MULTI VITAMIN & MINERAL PO) Take 1 tablet by mouth daily.    Yes Historical Provider, MD  HYDROcodone-acetaminophen (NORCO/VICODIN) 5-325 MG per tablet Take 1 tablet by mouth every 4 (four) hours as needed for moderate pain or severe pain. 12/06/13   Jovon [place] Burnetta Sabin, PA-C  ibuprofen (ADVIL,MOTRIN) 800 MG tablet Take 1 tablet (800 mg total) by mouth 3 (three) times daily. 12/06/13   Lorrine Kin, PA-C   Triage Vitals: BP 140/86  Pulse 67  Temp(Src) 98.4 F (36.9 C) (Oral)  Resp 14  SpO2 96% Physical Exam  Nursing note and vitals reviewed. Constitutional: He is oriented to person, place, and time. He appears well-developed and well-nourished.  Non-toxic appearance. He does not have a sickly appearance. He does not appear ill. No distress.  HENT:  Head: Normocephalic and atraumatic.  Eyes: EOM are normal.  Neck: Neck supple.  Pulmonary/Chest: Effort normal.  Musculoskeletal: He exhibits tenderness.       Right hand: He exhibits deformity and laceration.  Right hand: Tip of distal fifth phalanx missing with nail involvement no obvious foreign bodies no corticale involvement. Oozing bleeding.   Neurological: He is alert and oriented to person, place, and time.  Skin: Skin is warm and dry. He is not diaphoretic.  Psychiatric: He has a normal mood and affect. His behavior is normal.    ED Course  Procedures (including critical care time) COORDINATION OF CARE: 11:17 PM- Discussed treatment plan with patient at bedside and patient agreed to plan.   Labs Review Labs Reviewed - No data to  display  Imaging Review Dg Hand Complete Right  12/06/2013   CLINICAL DATA:  Injury to distal fifth phalanx. The dressing was not removed as the soft tissues were still bleeding.  EXAM: RIGHT HAND - COMPLETE 3+ VIEW  COMPARISON:  None.  FINDINGS: The dressing material obscures the finger. No definite fracture or soft tissue foreign body is present. The remainder of the hand is within normal limits.  IMPRESSION: 1. No acute fracture or radiopaque foreign body. 2. The soft tissues are obscured by the bandage.   Electronically Signed   By: Lawrence Santiago M.D.   On: 12/06/2013 21:06     EKG Interpretation None      MDM   Final diagnoses:  Injury of tip of finger of left hand   Pt with a laceration to tip of finger no bone involvement.  The tip of the finger has been removed, no obvious FB seen, wound was cleansed.  Surgiseal and dressing applied. Td updated. Elevation, Ice encouraged, keep the wound clean and dry. Discussed lab results, imaging results, and treatment plan with the patient. Return precautions given. Reports understanding and no other concerns at this time.  Patient is stable for discharge at this time.  Meds given in ED:  Medications  oxyCODONE-acetaminophen (PERCOCET/ROXICET) 5-325 MG per tablet 1 tablet (1 tablet Oral Given 12/06/13 2121)  gelatin adsorbable (GELFOAM/SURGIFOAM) sponge 12-7 mm 1 each (1 each Topical Given 12/06/13 2217)  Tdap (BOOSTRIX) injection 0.5 mL (0.5 mLs Intramuscular Given 12/06/13 2217)    Discharge Medication List as of 12/06/2013 11:07 PM    START taking these medications   Details  HYDROcodone-acetaminophen (NORCO/VICODIN) 5-325 MG per tablet Take 1 tablet by mouth every 4 (four) hours as needed for moderate pain or severe pain., Starting 12/06/2013, Until Discontinued, Print    ibuprofen (ADVIL,MOTRIN) 800 MG tablet Take 1 tablet (800 mg total) by mouth 3 (three) times daily., Starting 12/06/2013, Until Discontinued, Print          I  personally performed the services described in this documentation, which was scribed in my presence. The recorded information has been reviewed and is accurate.    Lorrine Kin, PA-C 12/09/13 1431

## 2013-12-11 NOTE — ED Provider Notes (Signed)
Medical screening examination/treatment/procedure(s) were performed by non-physician practitioner and as supervising physician I was immediately available for consultation/collaboration.   EKG Interpretation None        Wandra Arthurs, MD 12/11/13 [phone]

## 2015-11-03 DIAGNOSIS — M542 Cervicalgia: Secondary | ICD-10-CM | POA: Diagnosis not present

## 2015-11-03 DIAGNOSIS — M545 Low back pain: Secondary | ICD-10-CM | POA: Diagnosis not present

## 2015-11-03 DIAGNOSIS — M9900 Segmental and somatic dysfunction of head region: Secondary | ICD-10-CM | POA: Diagnosis not present

## 2015-11-03 DIAGNOSIS — M9904 Segmental and somatic dysfunction of sacral region: Secondary | ICD-10-CM | POA: Diagnosis not present

## 2015-11-10 DIAGNOSIS — Z0000 Encounter for general adult medical examination without abnormal findings: Secondary | ICD-10-CM | POA: Diagnosis not present

## 2015-11-10 DIAGNOSIS — Z13220 Encounter for screening for lipoid disorders: Secondary | ICD-10-CM | POA: Diagnosis not present

## 2016-03-05 DIAGNOSIS — J309 Allergic rhinitis, unspecified: Secondary | ICD-10-CM | POA: Diagnosis not present

## 2016-03-05 DIAGNOSIS — H1130 Conjunctival hemorrhage, unspecified eye: Secondary | ICD-10-CM | POA: Diagnosis not present

## 2016-03-06 DIAGNOSIS — H43311 Vitreous membranes and strands, right eye: Secondary | ICD-10-CM | POA: Diagnosis not present

## 2016-03-22 DIAGNOSIS — M542 Cervicalgia: Secondary | ICD-10-CM | POA: Diagnosis not present

## 2016-03-22 DIAGNOSIS — M9904 Segmental and somatic dysfunction of sacral region: Secondary | ICD-10-CM | POA: Diagnosis not present

## 2016-03-22 DIAGNOSIS — M9903 Segmental and somatic dysfunction of lumbar region: Secondary | ICD-10-CM | POA: Diagnosis not present

## 2016-03-22 DIAGNOSIS — M545 Low back pain: Secondary | ICD-10-CM | POA: Diagnosis not present

## 2016-05-29 ENCOUNTER — Other Ambulatory Visit: Payer: Self-pay | Admitting: Family Medicine

## 2016-05-29 DIAGNOSIS — T17308A Unspecified foreign body in larynx causing other injury, initial encounter: Secondary | ICD-10-CM | POA: Diagnosis not present

## 2016-05-29 DIAGNOSIS — R1310 Dysphagia, unspecified: Secondary | ICD-10-CM | POA: Diagnosis not present

## 2016-05-29 DIAGNOSIS — L309 Dermatitis, unspecified: Secondary | ICD-10-CM | POA: Diagnosis not present

## 2016-05-29 DIAGNOSIS — Z23 Encounter for immunization: Secondary | ICD-10-CM | POA: Diagnosis not present

## 2016-05-30 ENCOUNTER — Ambulatory Visit
Admission: RE | Admit: 2016-05-30 | Discharge: 2016-05-30 | Disposition: A | Discharge status: Home / Self Care | Payer: Self-pay | Source: Ambulatory Visit | Attending: Family Medicine | Admitting: Family Medicine

## 2016-05-30 DIAGNOSIS — R1310 Dysphagia, unspecified: Secondary | ICD-10-CM

## 2016-05-30 DIAGNOSIS — K219 Gastro-esophageal reflux disease without esophagitis: Secondary | ICD-10-CM | POA: Diagnosis not present

## 2016-06-07 DIAGNOSIS — K219 Gastro-esophageal reflux disease without esophagitis: Secondary | ICD-10-CM | POA: Diagnosis not present

## 2016-06-07 DIAGNOSIS — Z86010 Personal history of colonic polyps: Secondary | ICD-10-CM | POA: Diagnosis not present

## 2016-06-07 DIAGNOSIS — R1310 Dysphagia, unspecified: Secondary | ICD-10-CM | POA: Diagnosis not present

## 2016-06-15 DIAGNOSIS — R933 Abnormal findings on diagnostic imaging of other parts of digestive tract: Secondary | ICD-10-CM | POA: Diagnosis not present

## 2016-06-15 DIAGNOSIS — K2970 Gastritis, unspecified, without bleeding: Secondary | ICD-10-CM | POA: Diagnosis not present

## 2016-06-15 DIAGNOSIS — K222 Esophageal obstruction: Secondary | ICD-10-CM | POA: Diagnosis not present

## 2016-06-15 DIAGNOSIS — Z86010 Personal history of colonic polyps: Secondary | ICD-10-CM | POA: Diagnosis not present

## 2016-06-15 DIAGNOSIS — K5730 Diverticulosis of large intestine without perforation or abscess without bleeding: Secondary | ICD-10-CM | POA: Diagnosis not present

## 2016-06-15 DIAGNOSIS — K210 Gastro-esophageal reflux disease with esophagitis: Secondary | ICD-10-CM | POA: Diagnosis not present

## 2016-06-15 DIAGNOSIS — K449 Diaphragmatic hernia without obstruction or gangrene: Secondary | ICD-10-CM | POA: Diagnosis not present

## 2016-06-15 DIAGNOSIS — R1310 Dysphagia, unspecified: Secondary | ICD-10-CM | POA: Diagnosis not present

## 2016-07-27 DIAGNOSIS — K219 Gastro-esophageal reflux disease without esophagitis: Secondary | ICD-10-CM | POA: Diagnosis not present

## 2016-07-27 DIAGNOSIS — Z86010 Personal history of colonic polyps: Secondary | ICD-10-CM | POA: Diagnosis not present

## 2016-07-27 DIAGNOSIS — R1310 Dysphagia, unspecified: Secondary | ICD-10-CM | POA: Diagnosis not present

## 2016-08-09 DIAGNOSIS — J069 Acute upper respiratory infection, unspecified: Secondary | ICD-10-CM | POA: Diagnosis not present

## 2016-08-17 ENCOUNTER — Other Ambulatory Visit: Payer: Self-pay | Admitting: Family Medicine

## 2016-08-17 ENCOUNTER — Ambulatory Visit
Admission: RE | Admit: 2016-08-17 | Discharge: 2016-08-17 | Disposition: A | Discharge status: Home / Self Care | Payer: BLUE CROSS/BLUE SHIELD | Source: Ambulatory Visit | Attending: Family Medicine | Admitting: Family Medicine

## 2016-08-17 DIAGNOSIS — R0602 Shortness of breath: Secondary | ICD-10-CM

## 2016-08-17 DIAGNOSIS — R05 Cough: Secondary | ICD-10-CM | POA: Diagnosis not present

## 2016-09-18 ENCOUNTER — Other Ambulatory Visit: Payer: Self-pay | Admitting: Family Medicine

## 2016-09-18 ENCOUNTER — Ambulatory Visit
Admission: RE | Admit: 2016-09-18 | Discharge: 2016-09-18 | Disposition: A | Discharge status: Home / Self Care | Payer: BLUE CROSS/BLUE SHIELD | Source: Ambulatory Visit | Attending: Family Medicine | Admitting: Family Medicine

## 2016-09-18 DIAGNOSIS — S99911A Unspecified injury of right ankle, initial encounter: Secondary | ICD-10-CM

## 2016-09-18 DIAGNOSIS — M7989 Other specified soft tissue disorders: Secondary | ICD-10-CM | POA: Diagnosis not present

## 2016-09-20 DIAGNOSIS — S92151A Displaced avulsion fracture (chip fracture) of right talus, initial encounter for closed fracture: Secondary | ICD-10-CM | POA: Diagnosis not present

## 2016-10-05 DIAGNOSIS — M542 Cervicalgia: Secondary | ICD-10-CM | POA: Diagnosis not present

## 2016-10-05 DIAGNOSIS — M9903 Segmental and somatic dysfunction of lumbar region: Secondary | ICD-10-CM | POA: Diagnosis not present

## 2016-10-05 DIAGNOSIS — M546 Pain in thoracic spine: Secondary | ICD-10-CM | POA: Diagnosis not present

## 2016-10-05 DIAGNOSIS — M545 Low back pain: Secondary | ICD-10-CM | POA: Diagnosis not present

## 2016-10-24 DIAGNOSIS — S92151D Displaced avulsion fracture (chip fracture) of right talus, subsequent encounter for fracture with routine healing: Secondary | ICD-10-CM | POA: Diagnosis not present

## 2016-10-27 DIAGNOSIS — J069 Acute upper respiratory infection, unspecified: Secondary | ICD-10-CM | POA: Diagnosis not present

## 2016-11-08 DIAGNOSIS — S92151D Displaced avulsion fracture (chip fracture) of right talus, subsequent encounter for fracture with routine healing: Secondary | ICD-10-CM | POA: Diagnosis not present

## 2016-11-10 DIAGNOSIS — Z0000 Encounter for general adult medical examination without abnormal findings: Secondary | ICD-10-CM | POA: Diagnosis not present

## 2016-11-10 DIAGNOSIS — Z13220 Encounter for screening for lipoid disorders: Secondary | ICD-10-CM | POA: Diagnosis not present

## 2016-11-10 DIAGNOSIS — Z125 Encounter for screening for malignant neoplasm of prostate: Secondary | ICD-10-CM | POA: Diagnosis not present

## 2016-12-15 DIAGNOSIS — M545 Low back pain: Secondary | ICD-10-CM | POA: Diagnosis not present

## 2016-12-15 DIAGNOSIS — M9901 Segmental and somatic dysfunction of cervical region: Secondary | ICD-10-CM | POA: Diagnosis not present

## 2016-12-15 DIAGNOSIS — M546 Pain in thoracic spine: Secondary | ICD-10-CM | POA: Diagnosis not present

## 2016-12-15 DIAGNOSIS — M542 Cervicalgia: Secondary | ICD-10-CM | POA: Diagnosis not present

## 2017-01-15 DIAGNOSIS — M545 Low back pain: Secondary | ICD-10-CM | POA: Diagnosis not present

## 2017-01-15 DIAGNOSIS — M542 Cervicalgia: Secondary | ICD-10-CM | POA: Diagnosis not present

## 2017-01-15 DIAGNOSIS — M25571 Pain in right ankle and joints of right foot: Secondary | ICD-10-CM | POA: Diagnosis not present

## 2017-01-15 DIAGNOSIS — M9901 Segmental and somatic dysfunction of cervical region: Secondary | ICD-10-CM | POA: Diagnosis not present

## 2017-01-15 DIAGNOSIS — M546 Pain in thoracic spine: Secondary | ICD-10-CM | POA: Diagnosis not present

## 2017-01-15 DIAGNOSIS — M25551 Pain in right hip: Secondary | ICD-10-CM | POA: Diagnosis not present

## 2017-02-27 DIAGNOSIS — M542 Cervicalgia: Secondary | ICD-10-CM | POA: Diagnosis not present

## 2017-02-27 DIAGNOSIS — M25531 Pain in right wrist: Secondary | ICD-10-CM | POA: Diagnosis not present

## 2017-02-27 DIAGNOSIS — M9901 Segmental and somatic dysfunction of cervical region: Secondary | ICD-10-CM | POA: Diagnosis not present

## 2017-02-27 DIAGNOSIS — M545 Low back pain: Secondary | ICD-10-CM | POA: Diagnosis not present

## 2017-03-08 DIAGNOSIS — Z0100 Encounter for examination of eyes and vision without abnormal findings: Secondary | ICD-10-CM | POA: Diagnosis not present

## 2017-05-28 DIAGNOSIS — J343 Hypertrophy of nasal turbinates: Secondary | ICD-10-CM | POA: Diagnosis not present

## 2017-07-12 DIAGNOSIS — M546 Pain in thoracic spine: Secondary | ICD-10-CM | POA: Diagnosis not present

## 2017-07-12 DIAGNOSIS — M542 Cervicalgia: Secondary | ICD-10-CM | POA: Diagnosis not present

## 2017-07-12 DIAGNOSIS — M9903 Segmental and somatic dysfunction of lumbar region: Secondary | ICD-10-CM | POA: Diagnosis not present

## 2017-07-12 DIAGNOSIS — M545 Low back pain: Secondary | ICD-10-CM | POA: Diagnosis not present

## 2017-11-13 DIAGNOSIS — Z125 Encounter for screening for malignant neoplasm of prostate: Secondary | ICD-10-CM | POA: Diagnosis not present

## 2017-11-13 DIAGNOSIS — Z0000 Encounter for general adult medical examination without abnormal findings: Secondary | ICD-10-CM | POA: Diagnosis not present

## 2017-11-13 DIAGNOSIS — Z13220 Encounter for screening for lipoid disorders: Secondary | ICD-10-CM | POA: Diagnosis not present

## 2018-01-16 DIAGNOSIS — M9901 Segmental and somatic dysfunction of cervical region: Secondary | ICD-10-CM | POA: Diagnosis not present

## 2018-01-16 DIAGNOSIS — M545 Low back pain: Secondary | ICD-10-CM | POA: Diagnosis not present

## 2018-01-16 DIAGNOSIS — M546 Pain in thoracic spine: Secondary | ICD-10-CM | POA: Diagnosis not present

## 2018-01-16 DIAGNOSIS — M542 Cervicalgia: Secondary | ICD-10-CM | POA: Diagnosis not present

## 2018-01-31 DIAGNOSIS — M542 Cervicalgia: Secondary | ICD-10-CM | POA: Diagnosis not present

## 2018-01-31 DIAGNOSIS — M546 Pain in thoracic spine: Secondary | ICD-10-CM | POA: Diagnosis not present

## 2018-01-31 DIAGNOSIS — M9901 Segmental and somatic dysfunction of cervical region: Secondary | ICD-10-CM | POA: Diagnosis not present

## 2018-01-31 DIAGNOSIS — M545 Low back pain: Secondary | ICD-10-CM | POA: Diagnosis not present

## 2018-04-04 DIAGNOSIS — M545 Low back pain: Secondary | ICD-10-CM | POA: Diagnosis not present

## 2018-04-04 DIAGNOSIS — M9901 Segmental and somatic dysfunction of cervical region: Secondary | ICD-10-CM | POA: Diagnosis not present

## 2018-04-04 DIAGNOSIS — M542 Cervicalgia: Secondary | ICD-10-CM | POA: Diagnosis not present

## 2018-04-04 DIAGNOSIS — M9903 Segmental and somatic dysfunction of lumbar region: Secondary | ICD-10-CM | POA: Diagnosis not present

## 2018-06-12 DIAGNOSIS — M542 Cervicalgia: Secondary | ICD-10-CM | POA: Diagnosis not present

## 2018-06-12 DIAGNOSIS — M9904 Segmental and somatic dysfunction of sacral region: Secondary | ICD-10-CM | POA: Diagnosis not present

## 2018-06-12 DIAGNOSIS — M9903 Segmental and somatic dysfunction of lumbar region: Secondary | ICD-10-CM | POA: Diagnosis not present

## 2018-06-12 DIAGNOSIS — M545 Low back pain: Secondary | ICD-10-CM | POA: Diagnosis not present

## 2018-07-22 DIAGNOSIS — N503 Cyst of epididymis: Secondary | ICD-10-CM | POA: Diagnosis not present

## 2018-07-22 DIAGNOSIS — N433 Hydrocele, unspecified: Secondary | ICD-10-CM | POA: Diagnosis not present

## 2018-07-22 DIAGNOSIS — Z23 Encounter for immunization: Secondary | ICD-10-CM | POA: Diagnosis not present

## 2018-07-22 DIAGNOSIS — K469 Unspecified abdominal hernia without obstruction or gangrene: Secondary | ICD-10-CM | POA: Diagnosis not present

## 2018-07-25 DIAGNOSIS — K4020 Bilateral inguinal hernia, without obstruction or gangrene, not specified as recurrent: Secondary | ICD-10-CM | POA: Diagnosis not present

## 2018-07-25 DIAGNOSIS — K4091 Unilateral inguinal hernia, without obstruction or gangrene, recurrent: Secondary | ICD-10-CM | POA: Diagnosis not present

## 2018-08-05 DIAGNOSIS — J069 Acute upper respiratory infection, unspecified: Secondary | ICD-10-CM | POA: Diagnosis not present

## 2018-08-27 DIAGNOSIS — K4090 Unilateral inguinal hernia, without obstruction or gangrene, not specified as recurrent: Secondary | ICD-10-CM | POA: Diagnosis not present

## 2018-08-27 DIAGNOSIS — K4021 Bilateral inguinal hernia, without obstruction or gangrene, recurrent: Secondary | ICD-10-CM | POA: Diagnosis not present

## 2018-08-27 DIAGNOSIS — D176 Benign lipomatous neoplasm of spermatic cord: Secondary | ICD-10-CM | POA: Diagnosis not present

## 2018-12-03 DIAGNOSIS — Z0000 Encounter for general adult medical examination without abnormal findings: Secondary | ICD-10-CM | POA: Diagnosis not present

## 2018-12-12 DIAGNOSIS — Z0000 Encounter for general adult medical examination without abnormal findings: Secondary | ICD-10-CM | POA: Diagnosis not present

## 2018-12-12 DIAGNOSIS — Z789 Other specified health status: Secondary | ICD-10-CM | POA: Diagnosis not present

## 2018-12-12 DIAGNOSIS — Z13220 Encounter for screening for lipoid disorders: Secondary | ICD-10-CM | POA: Diagnosis not present

## 2018-12-12 DIAGNOSIS — Z125 Encounter for screening for malignant neoplasm of prostate: Secondary | ICD-10-CM | POA: Diagnosis not present

## 2018-12-12 DIAGNOSIS — Z8249 Family history of ischemic heart disease and other diseases of the circulatory system: Secondary | ICD-10-CM | POA: Diagnosis not present

## 2019-01-01 IMAGING — DX DG CHEST 2V
2 series · 2 of 2 positions shown · non-contrast
Comparison: Chest x-ray of March 26, 2006

CLINICAL DATA: Cough, chest congestion, and shortness of breath for
the past 2 weeks; nonsmoker.

EXAM:
CHEST  2 VIEW

[dg chest 2 view (1 of 2)]
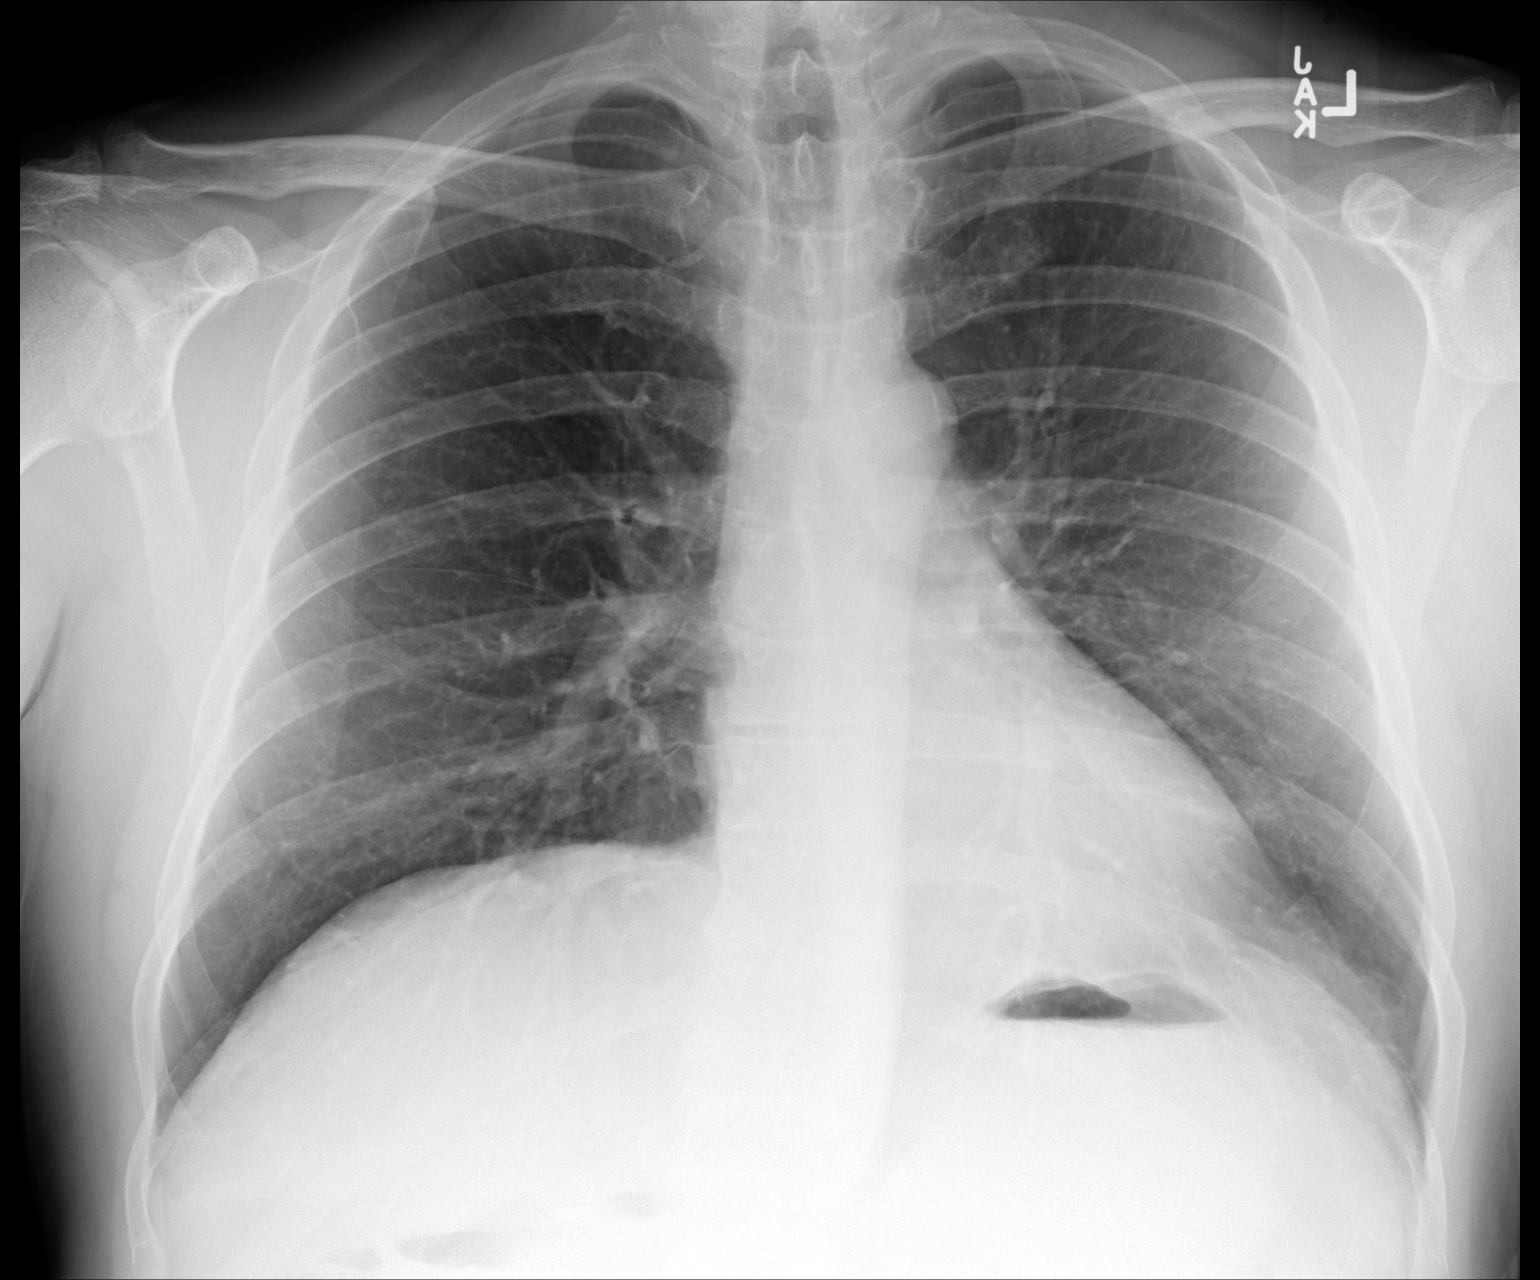

[dg chest 2 view (2 of 2)]
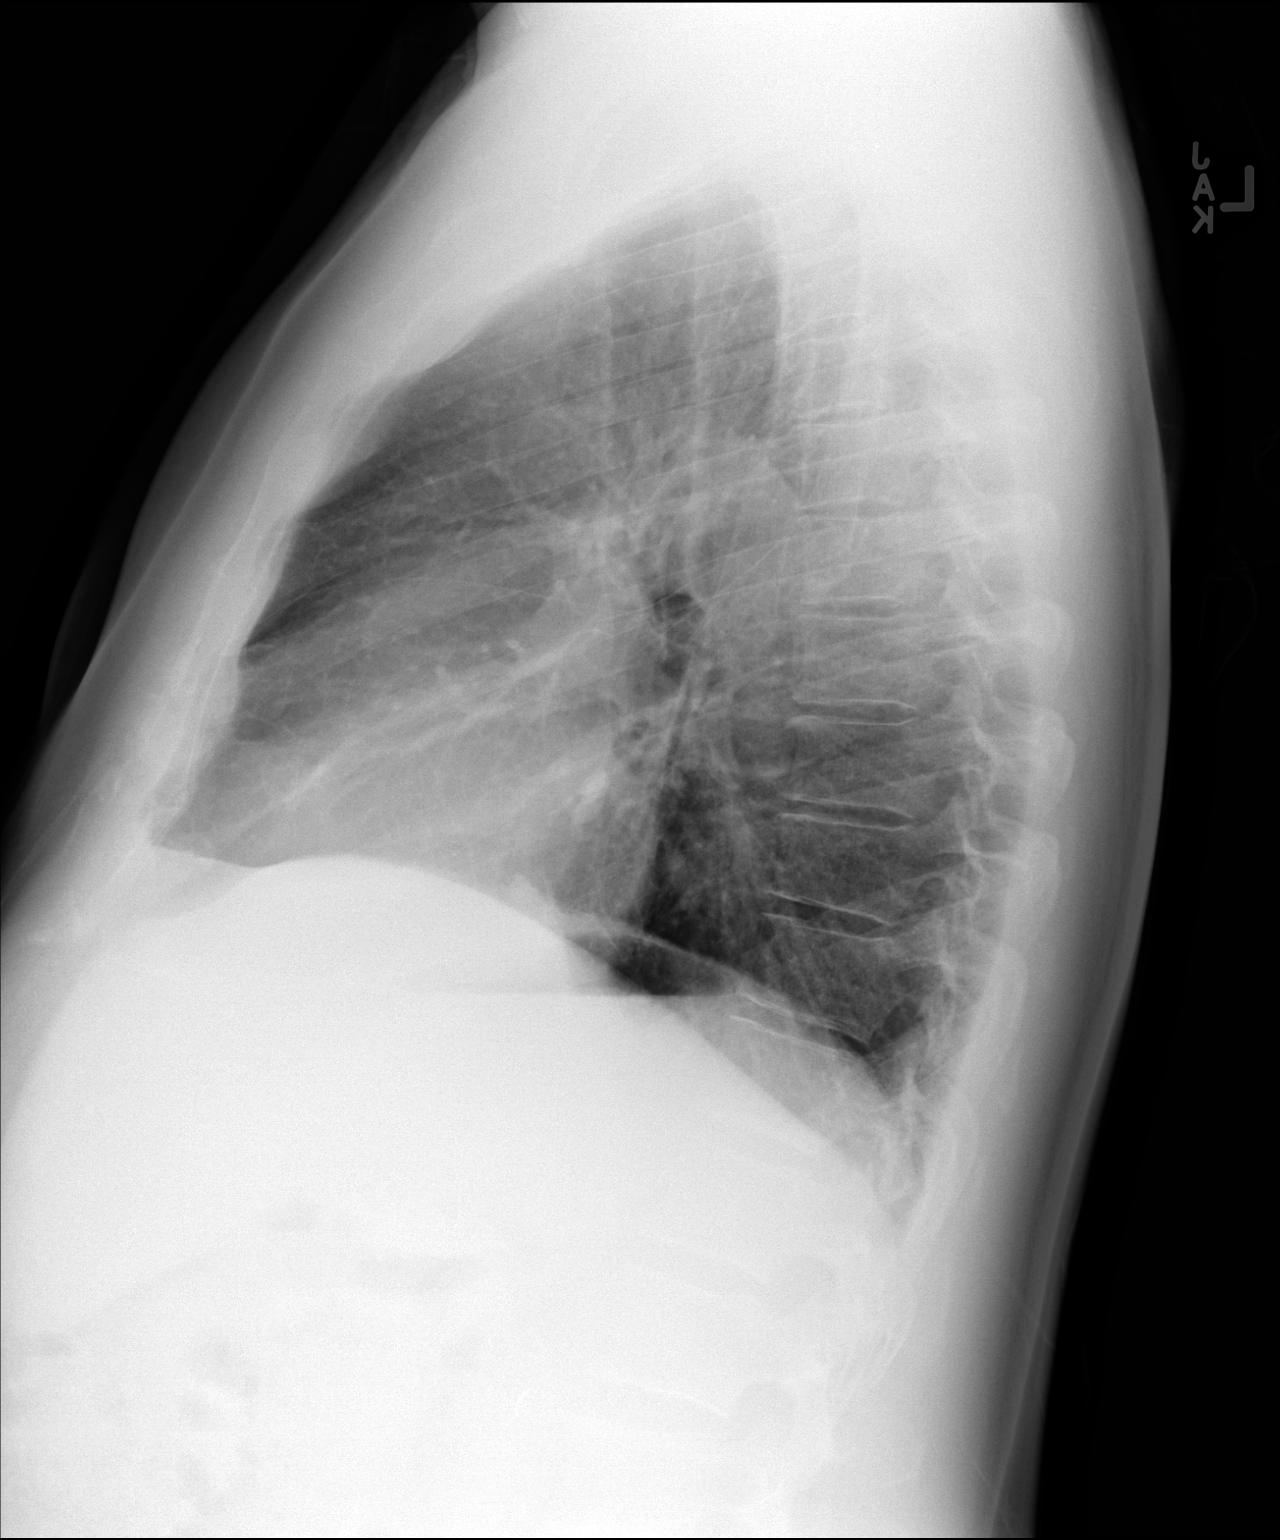

[2 of 2 positions shown; findings below may reference images not displayed]

FINDINGS: The lungs are adequately inflated and clear. The heart and pulmonary
vascularity are normal. The mediastinum is normal in width. There is
no pleural effusion. The bony thorax exhibits no acute abnormality.
IMPRESSION: There is no active cardiopulmonary disease.

## 2019-02-02 IMAGING — DX DG ANKLE COMPLETE 3+V*R*
3 series · 3 of 3 positions shown · non-contrast
Comparison: None in PACs

CLINICAL DATA: In swelling over the malleolar regions of the ankle
following injury on September 16, 2016.

EXAM:
RIGHT ANKLE - COMPLETE 3+ VIEW

[dg ankle complete right (1 of 3)]
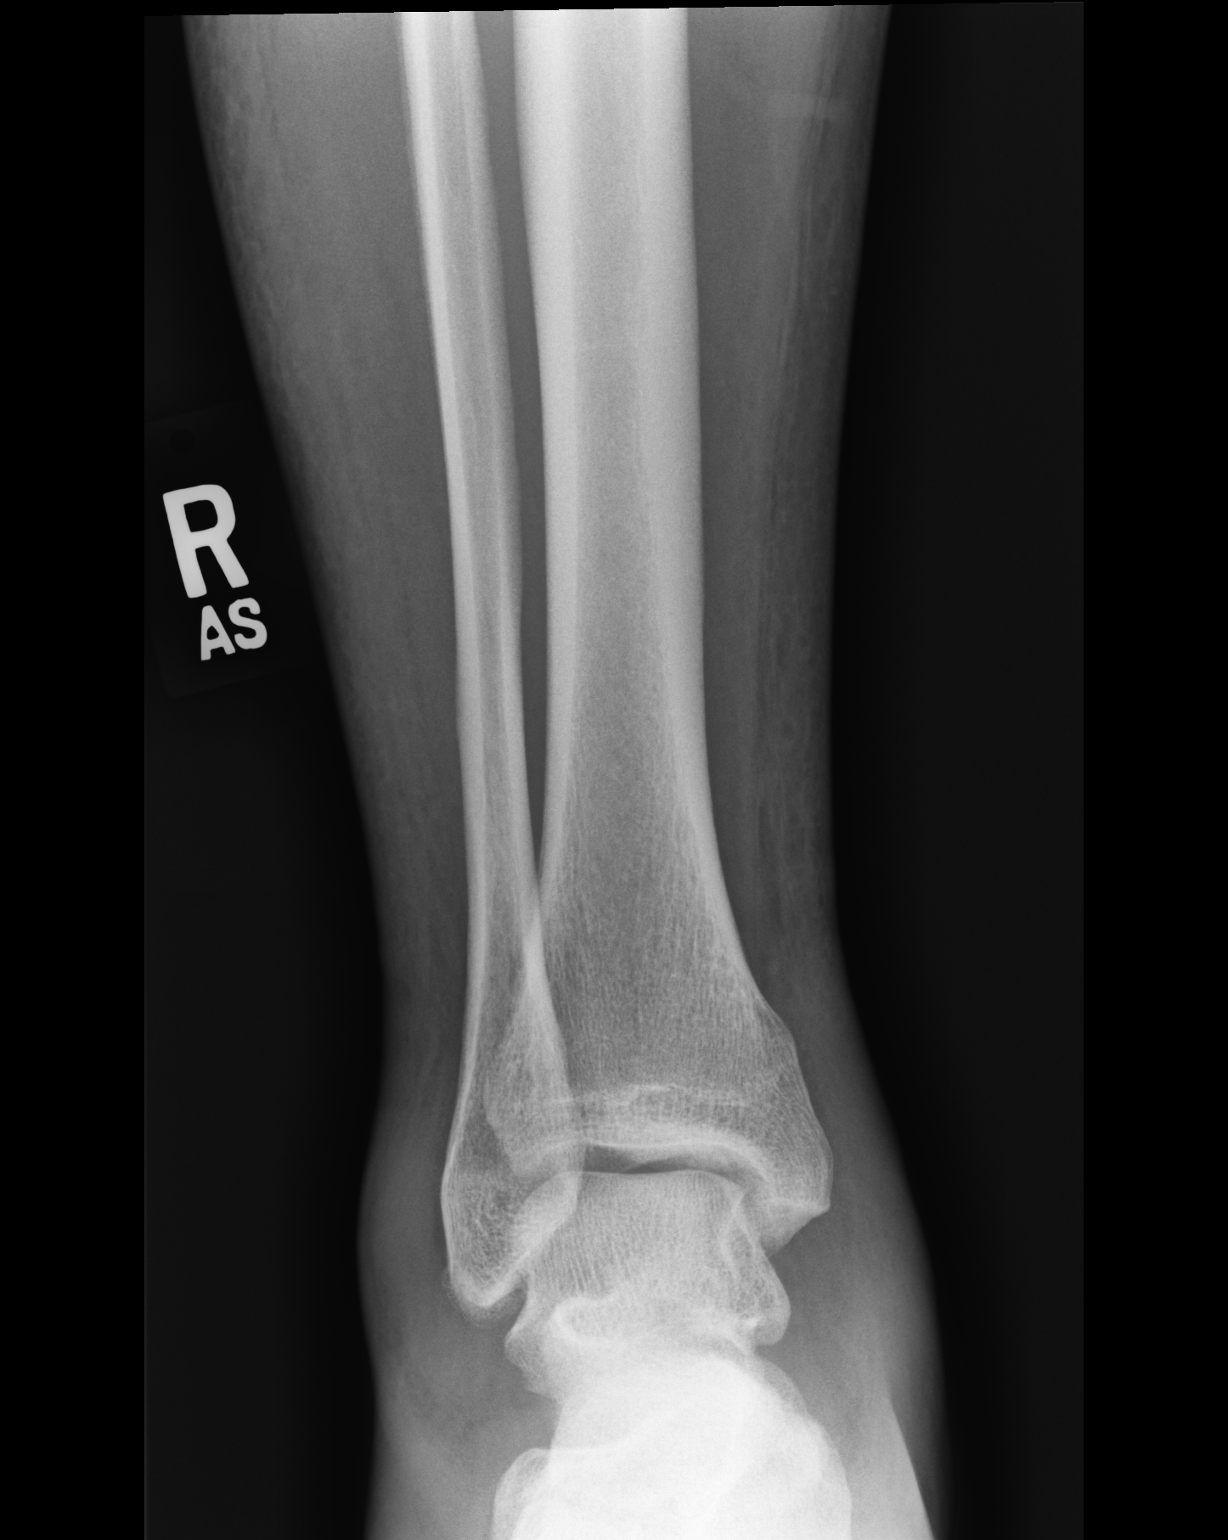

[dg ankle complete right (2 of 3)]
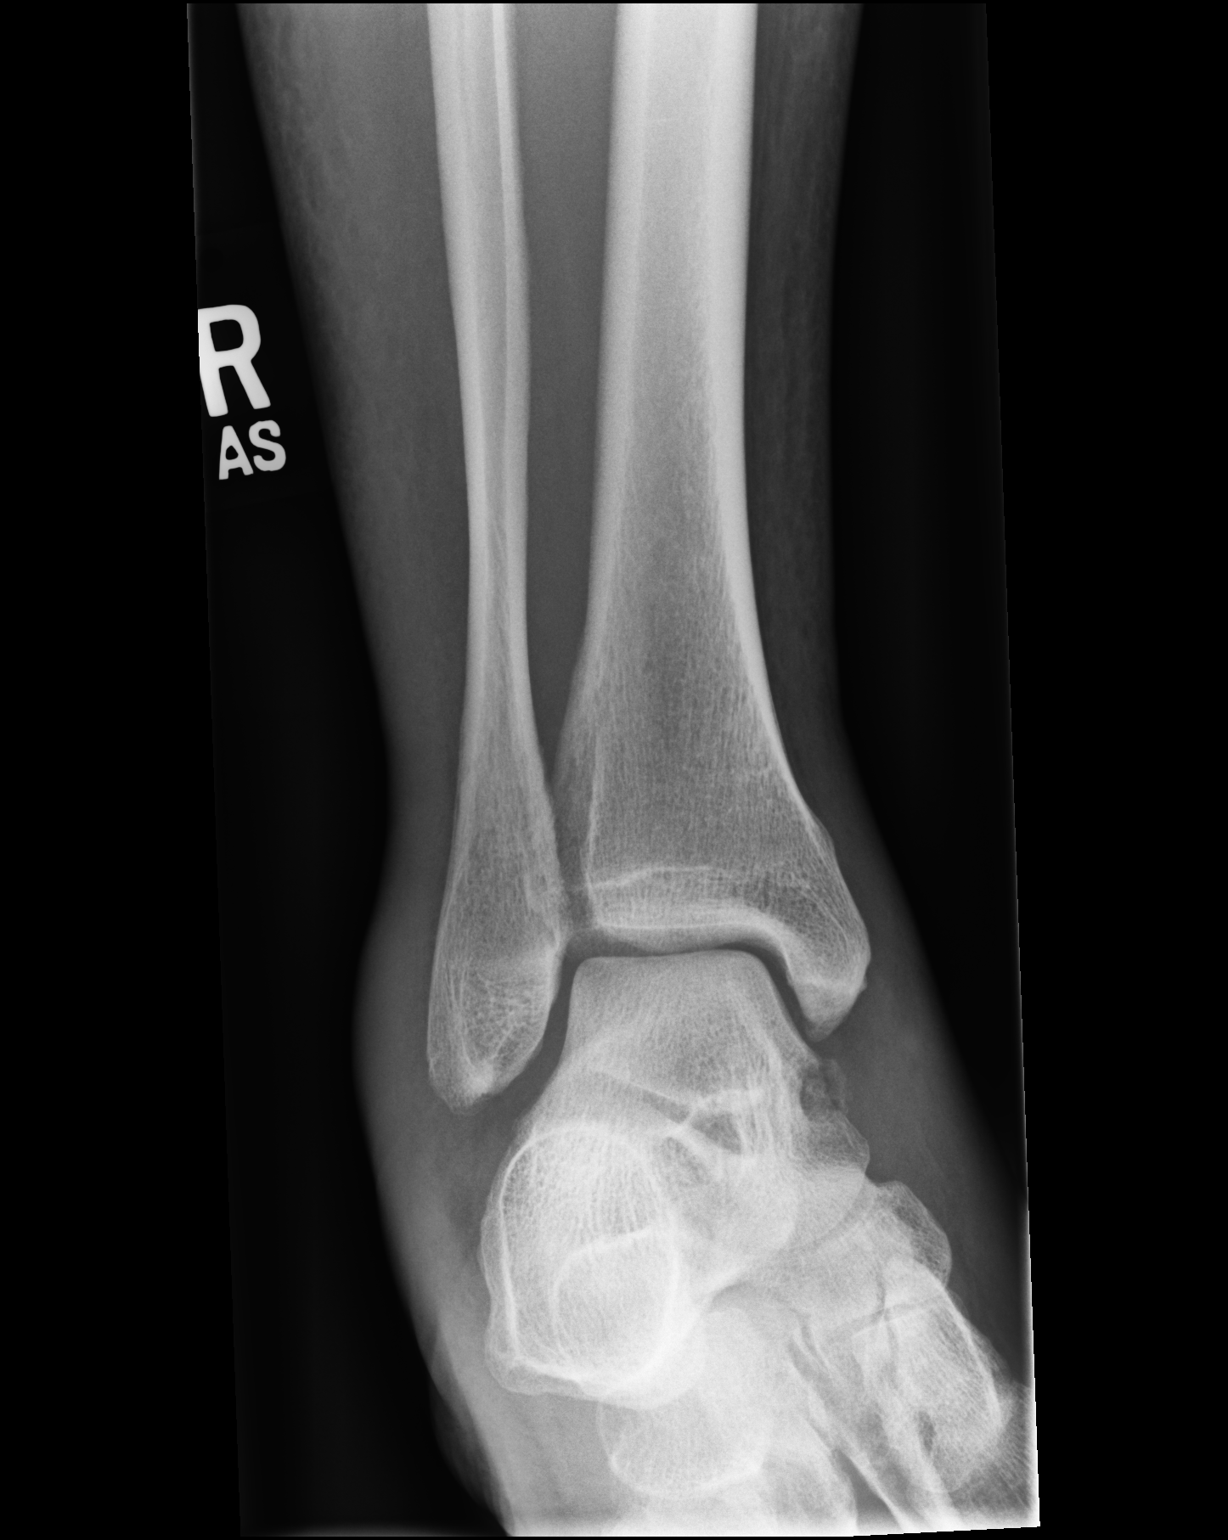

[dg ankle complete right (3 of 3)]
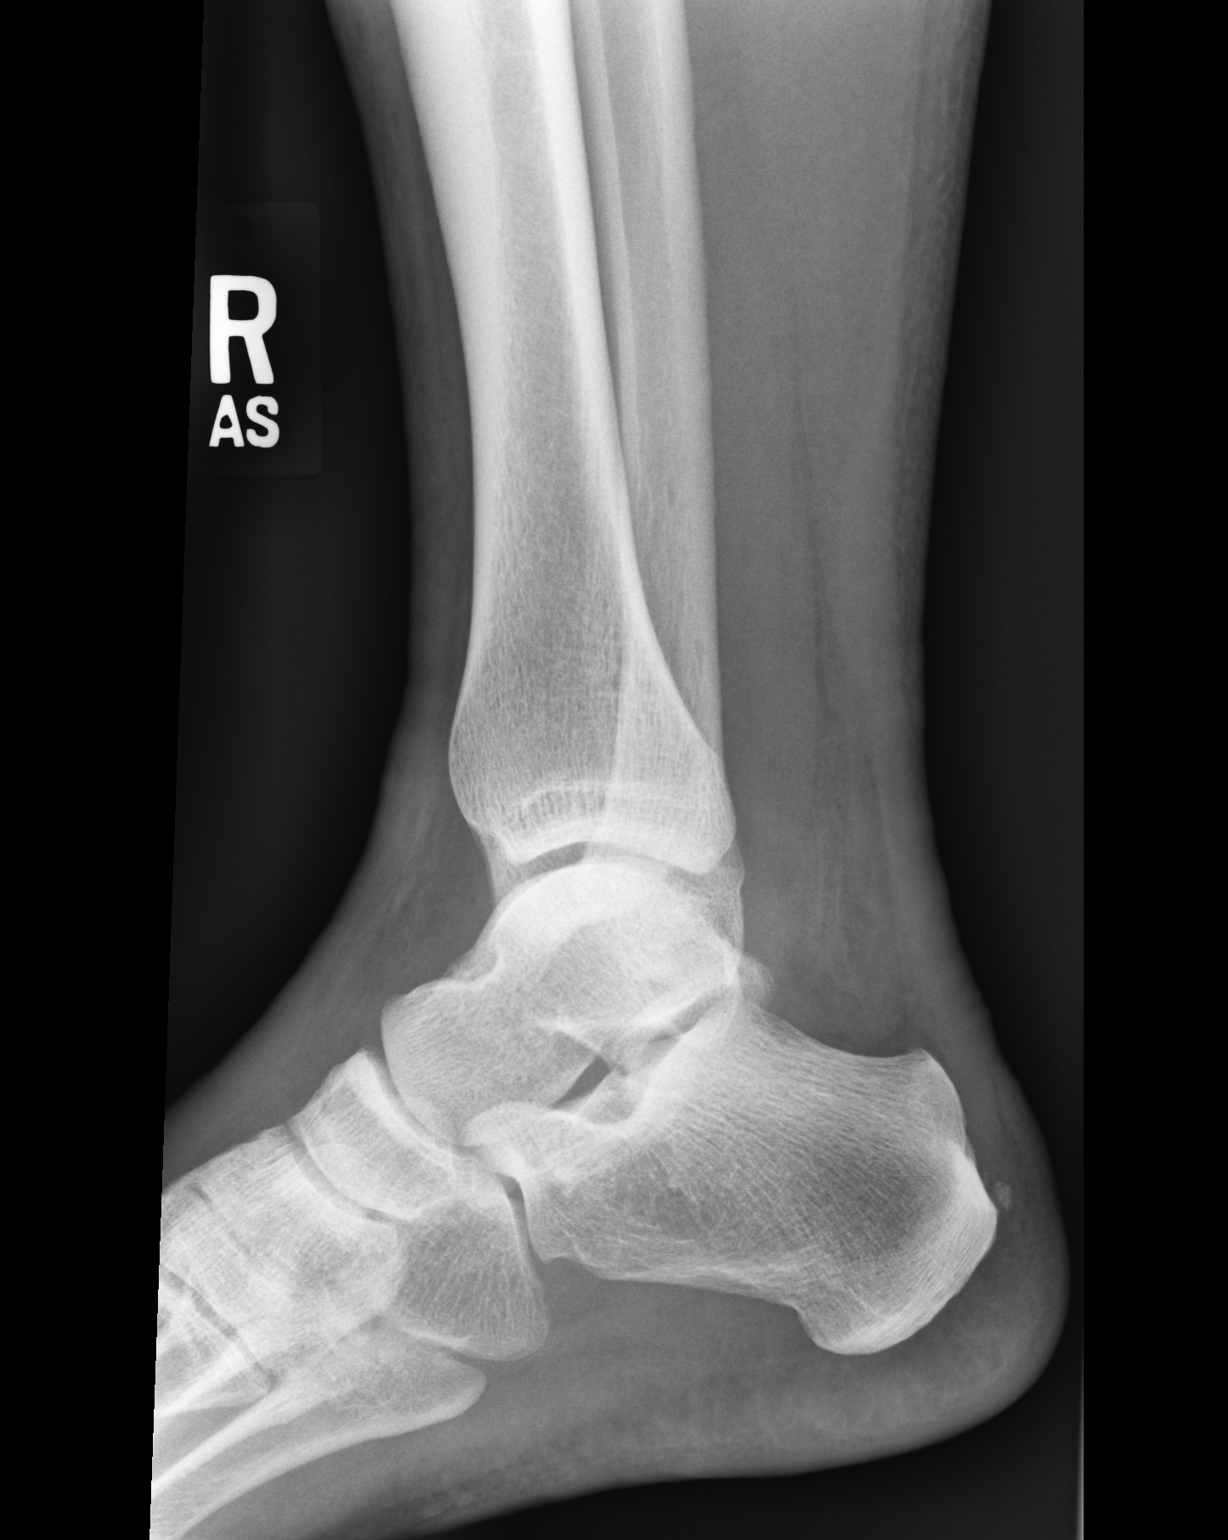

[3 of 3 positions shown; findings below may reference images not displayed]

FINDINGS: The bones are subjectively adequately mineralized. The malleolar I
are intact. There is bony irregularity involving the medial aspect
of the talus inferior to the inferior most aspect of the medial
malleolus. There is diffuse soft tissue swelling. The calcaneus is
unremarkable.
IMPRESSION: Possible avulsion fracture involving the medial aspect of the talus
just inferior to the medial malleolus. No acute malleolar fracture.

Ankle MRI would be a useful next imaging step to evaluate not only
the bony abnormality but also the associated ligamentous injuries.

## 2019-03-13 DIAGNOSIS — Z23 Encounter for immunization: Secondary | ICD-10-CM | POA: Diagnosis not present

## 2019-05-28 DIAGNOSIS — M545 Low back pain: Secondary | ICD-10-CM | POA: Diagnosis not present

## 2019-05-28 DIAGNOSIS — M542 Cervicalgia: Secondary | ICD-10-CM | POA: Diagnosis not present

## 2019-05-28 DIAGNOSIS — M9901 Segmental and somatic dysfunction of cervical region: Secondary | ICD-10-CM | POA: Diagnosis not present

## 2019-05-28 DIAGNOSIS — G44209 Tension-type headache, unspecified, not intractable: Secondary | ICD-10-CM | POA: Diagnosis not present

## 2019-08-07 ENCOUNTER — Ambulatory Visit: Payer: BC Managed Care – PPO

## 2019-08-18 DIAGNOSIS — M545 Low back pain: Secondary | ICD-10-CM | POA: Diagnosis not present

## 2019-08-18 DIAGNOSIS — M542 Cervicalgia: Secondary | ICD-10-CM | POA: Diagnosis not present

## 2019-08-18 DIAGNOSIS — M9903 Segmental and somatic dysfunction of lumbar region: Secondary | ICD-10-CM | POA: Diagnosis not present

## 2019-08-18 DIAGNOSIS — M9901 Segmental and somatic dysfunction of cervical region: Secondary | ICD-10-CM | POA: Diagnosis not present

## 2019-08-20 DIAGNOSIS — M542 Cervicalgia: Secondary | ICD-10-CM | POA: Diagnosis not present

## 2019-08-20 DIAGNOSIS — M9901 Segmental and somatic dysfunction of cervical region: Secondary | ICD-10-CM | POA: Diagnosis not present

## 2019-08-20 DIAGNOSIS — M545 Low back pain: Secondary | ICD-10-CM | POA: Diagnosis not present

## 2019-08-20 DIAGNOSIS — M9903 Segmental and somatic dysfunction of lumbar region: Secondary | ICD-10-CM | POA: Diagnosis not present

## 2019-09-01 DIAGNOSIS — M545 Low back pain: Secondary | ICD-10-CM | POA: Diagnosis not present

## 2019-09-01 DIAGNOSIS — M9901 Segmental and somatic dysfunction of cervical region: Secondary | ICD-10-CM | POA: Diagnosis not present

## 2019-09-01 DIAGNOSIS — M9903 Segmental and somatic dysfunction of lumbar region: Secondary | ICD-10-CM | POA: Diagnosis not present

## 2019-09-01 DIAGNOSIS — M542 Cervicalgia: Secondary | ICD-10-CM | POA: Diagnosis not present

## 2019-09-26 ENCOUNTER — Ambulatory Visit: Payer: BC Managed Care – PPO | Attending: Internal Medicine

## 2019-09-26 DIAGNOSIS — Z23 Encounter for immunization: Secondary | ICD-10-CM

## 2019-09-26 NOTE — Progress Notes (Signed)
   Covid-19 Vaccination Clinic  Name:  Ronald Cochran    MRN: [id number] DOB: [birth year]  09/26/2019  [name] was observed post Covid-19 immunization for 15 minutes without incident. He was provided with Vaccine Information Sheet and instruction to access the V-Safe system.   [name] was instructed to call 911 with any severe reactions post vaccine: Marland Kitchen Difficulty breathing  . Swelling of face and throat  . A fast heartbeat  . A bad rash all over body  . Dizziness and weakness   Immunizations Administered    Name Date Dose VIS Date Route   Pfizer COVID-19 Vaccine 09/26/2019  3:52 PM 0.3 mL 06/06/2019 Intramuscular   Manufacturer: [employer]   Lot: [id number]   [place]: [id number]

## 2019-10-20 ENCOUNTER — Ambulatory Visit: Payer: BC Managed Care – PPO | Attending: Internal Medicine

## 2019-10-20 DIAGNOSIS — Z23 Encounter for immunization: Secondary | ICD-10-CM

## 2019-10-20 NOTE — Progress Notes (Signed)
   Covid-19 Vaccination Clinic  Name:  Cain Panther    MRN: [id number] DOB: Dec 02, 1960  10/20/2019  Mr. Sainz was observed post Covid-19 immunization for 15 minutes without incident. He was provided with Vaccine Information Sheet and instruction to access the V-Safe system.   Mr. Govoni was instructed to call 911 with any severe reactions post vaccine: Marland Kitchen Difficulty breathing  . Swelling of face and throat  . A fast heartbeat  . A bad rash all over body  . Dizziness and weakness   Immunizations Administered    Name Date Dose VIS Date Route   Pfizer COVID-19 Vaccine 10/20/2019  3:00 PM 0.3 mL 08/20/2018 Intramuscular   Manufacturer: [place]   Lot: [id number]   [place]: [id number]

## 2019-10-22 DIAGNOSIS — R300 Dysuria: Secondary | ICD-10-CM | POA: Diagnosis not present

## 2019-10-23 DIAGNOSIS — R399 Unspecified symptoms and signs involving the genitourinary system: Secondary | ICD-10-CM | POA: Diagnosis not present

## 2019-12-05 DIAGNOSIS — Z23 Encounter for immunization: Secondary | ICD-10-CM | POA: Diagnosis not present

## 2019-12-05 DIAGNOSIS — Z13220 Encounter for screening for lipoid disorders: Secondary | ICD-10-CM | POA: Diagnosis not present

## 2019-12-05 DIAGNOSIS — Z0000 Encounter for general adult medical examination without abnormal findings: Secondary | ICD-10-CM | POA: Diagnosis not present

## 2020-01-19 DIAGNOSIS — M542 Cervicalgia: Secondary | ICD-10-CM | POA: Diagnosis not present

## 2020-01-19 DIAGNOSIS — M9901 Segmental and somatic dysfunction of cervical region: Secondary | ICD-10-CM | POA: Diagnosis not present

## 2020-01-19 DIAGNOSIS — M545 Low back pain: Secondary | ICD-10-CM | POA: Diagnosis not present

## 2020-01-19 DIAGNOSIS — M546 Pain in thoracic spine: Secondary | ICD-10-CM | POA: Diagnosis not present

## 2020-02-04 DIAGNOSIS — Z23 Encounter for immunization: Secondary | ICD-10-CM | POA: Diagnosis not present

## 2020-02-14 DIAGNOSIS — F413 Other mixed anxiety disorders: Secondary | ICD-10-CM | POA: Diagnosis not present

## 2020-02-21 DIAGNOSIS — F413 Other mixed anxiety disorders: Secondary | ICD-10-CM | POA: Diagnosis not present

## 2020-02-26 DIAGNOSIS — H1132 Conjunctival hemorrhage, left eye: Secondary | ICD-10-CM | POA: Diagnosis not present

## 2020-02-28 DIAGNOSIS — F413 Other mixed anxiety disorders: Secondary | ICD-10-CM | POA: Diagnosis not present

## 2020-03-06 DIAGNOSIS — F413 Other mixed anxiety disorders: Secondary | ICD-10-CM | POA: Diagnosis not present

## 2020-03-15 DIAGNOSIS — M898X7 Other specified disorders of bone, ankle and foot: Secondary | ICD-10-CM | POA: Diagnosis not present

## 2020-03-15 DIAGNOSIS — S9782XA Crushing injury of left foot, initial encounter: Secondary | ICD-10-CM | POA: Diagnosis not present

## 2020-03-15 DIAGNOSIS — W208XXA Other cause of strike by thrown, projected or falling object, initial encounter: Secondary | ICD-10-CM | POA: Diagnosis not present

## 2020-03-27 DIAGNOSIS — F413 Other mixed anxiety disorders: Secondary | ICD-10-CM | POA: Diagnosis not present

## 2020-04-03 DIAGNOSIS — F413 Other mixed anxiety disorders: Secondary | ICD-10-CM | POA: Diagnosis not present

## 2020-04-10 DIAGNOSIS — F413 Other mixed anxiety disorders: Secondary | ICD-10-CM | POA: Diagnosis not present

## 2020-04-15 DIAGNOSIS — Z23 Encounter for immunization: Secondary | ICD-10-CM | POA: Diagnosis not present

## 2020-04-17 DIAGNOSIS — F413 Other mixed anxiety disorders: Secondary | ICD-10-CM | POA: Diagnosis not present

## 2020-04-24 DIAGNOSIS — F413 Other mixed anxiety disorders: Secondary | ICD-10-CM | POA: Diagnosis not present

## 2020-05-08 DIAGNOSIS — F413 Other mixed anxiety disorders: Secondary | ICD-10-CM | POA: Diagnosis not present

## 2020-05-15 DIAGNOSIS — F413 Other mixed anxiety disorders: Secondary | ICD-10-CM | POA: Diagnosis not present

## 2020-05-22 DIAGNOSIS — F413 Other mixed anxiety disorders: Secondary | ICD-10-CM | POA: Diagnosis not present

## 2020-06-05 DIAGNOSIS — F413 Other mixed anxiety disorders: Secondary | ICD-10-CM | POA: Diagnosis not present

## 2020-06-12 DIAGNOSIS — F413 Other mixed anxiety disorders: Secondary | ICD-10-CM | POA: Diagnosis not present

## 2020-07-03 DIAGNOSIS — F413 Other mixed anxiety disorders: Secondary | ICD-10-CM | POA: Diagnosis not present

## 2020-07-15 DIAGNOSIS — M542 Cervicalgia: Secondary | ICD-10-CM | POA: Diagnosis not present

## 2020-07-15 DIAGNOSIS — M9901 Segmental and somatic dysfunction of cervical region: Secondary | ICD-10-CM | POA: Diagnosis not present

## 2020-07-15 DIAGNOSIS — M9903 Segmental and somatic dysfunction of lumbar region: Secondary | ICD-10-CM | POA: Diagnosis not present

## 2020-07-15 DIAGNOSIS — M545 Low back pain, unspecified: Secondary | ICD-10-CM | POA: Diagnosis not present

## 2020-07-17 DIAGNOSIS — F413 Other mixed anxiety disorders: Secondary | ICD-10-CM | POA: Diagnosis not present

## 2020-07-22 DIAGNOSIS — M9901 Segmental and somatic dysfunction of cervical region: Secondary | ICD-10-CM | POA: Diagnosis not present

## 2020-07-22 DIAGNOSIS — M545 Low back pain, unspecified: Secondary | ICD-10-CM | POA: Diagnosis not present

## 2020-07-22 DIAGNOSIS — M9903 Segmental and somatic dysfunction of lumbar region: Secondary | ICD-10-CM | POA: Diagnosis not present

## 2020-07-22 DIAGNOSIS — M542 Cervicalgia: Secondary | ICD-10-CM | POA: Diagnosis not present

## 2020-07-24 DIAGNOSIS — F413 Other mixed anxiety disorders: Secondary | ICD-10-CM | POA: Diagnosis not present

## 2020-07-31 DIAGNOSIS — F413 Other mixed anxiety disorders: Secondary | ICD-10-CM | POA: Diagnosis not present

## 2020-08-21 DIAGNOSIS — F413 Other mixed anxiety disorders: Secondary | ICD-10-CM | POA: Diagnosis not present

## 2020-09-11 DIAGNOSIS — F413 Other mixed anxiety disorders: Secondary | ICD-10-CM | POA: Diagnosis not present

## 2020-09-25 DIAGNOSIS — F413 Other mixed anxiety disorders: Secondary | ICD-10-CM | POA: Diagnosis not present

## 2020-10-06 DIAGNOSIS — R319 Hematuria, unspecified: Secondary | ICD-10-CM | POA: Diagnosis not present

## 2020-10-09 DIAGNOSIS — F413 Other mixed anxiety disorders: Secondary | ICD-10-CM | POA: Diagnosis not present

## 2020-10-12 DIAGNOSIS — M9901 Segmental and somatic dysfunction of cervical region: Secondary | ICD-10-CM | POA: Diagnosis not present

## 2020-10-12 DIAGNOSIS — M545 Low back pain, unspecified: Secondary | ICD-10-CM | POA: Diagnosis not present

## 2020-10-12 DIAGNOSIS — M25512 Pain in left shoulder: Secondary | ICD-10-CM | POA: Diagnosis not present

## 2020-10-12 DIAGNOSIS — M9902 Segmental and somatic dysfunction of thoracic region: Secondary | ICD-10-CM | POA: Diagnosis not present

## 2020-10-13 DIAGNOSIS — M9902 Segmental and somatic dysfunction of thoracic region: Secondary | ICD-10-CM | POA: Diagnosis not present

## 2020-10-13 DIAGNOSIS — M546 Pain in thoracic spine: Secondary | ICD-10-CM | POA: Diagnosis not present

## 2020-10-13 DIAGNOSIS — M549 Dorsalgia, unspecified: Secondary | ICD-10-CM | POA: Diagnosis not present

## 2020-10-13 DIAGNOSIS — M25512 Pain in left shoulder: Secondary | ICD-10-CM | POA: Diagnosis not present

## 2020-10-13 DIAGNOSIS — M545 Low back pain, unspecified: Secondary | ICD-10-CM | POA: Diagnosis not present

## 2020-10-13 DIAGNOSIS — M9901 Segmental and somatic dysfunction of cervical region: Secondary | ICD-10-CM | POA: Diagnosis not present

## 2020-10-14 DIAGNOSIS — M546 Pain in thoracic spine: Secondary | ICD-10-CM | POA: Diagnosis not present

## 2020-10-20 DIAGNOSIS — M25512 Pain in left shoulder: Secondary | ICD-10-CM | POA: Diagnosis not present

## 2020-10-20 DIAGNOSIS — M545 Low back pain, unspecified: Secondary | ICD-10-CM | POA: Diagnosis not present

## 2020-10-20 DIAGNOSIS — M9902 Segmental and somatic dysfunction of thoracic region: Secondary | ICD-10-CM | POA: Diagnosis not present

## 2020-10-20 DIAGNOSIS — M9901 Segmental and somatic dysfunction of cervical region: Secondary | ICD-10-CM | POA: Diagnosis not present

## 2020-10-30 DIAGNOSIS — F413 Other mixed anxiety disorders: Secondary | ICD-10-CM | POA: Diagnosis not present

## 2020-12-30 DIAGNOSIS — Z13220 Encounter for screening for lipoid disorders: Secondary | ICD-10-CM | POA: Diagnosis not present

## 2020-12-30 DIAGNOSIS — M9905 Segmental and somatic dysfunction of pelvic region: Secondary | ICD-10-CM | POA: Diagnosis not present

## 2020-12-30 DIAGNOSIS — M9903 Segmental and somatic dysfunction of lumbar region: Secondary | ICD-10-CM | POA: Diagnosis not present

## 2020-12-30 DIAGNOSIS — Z125 Encounter for screening for malignant neoplasm of prostate: Secondary | ICD-10-CM | POA: Diagnosis not present

## 2020-12-30 DIAGNOSIS — Z0000 Encounter for general adult medical examination without abnormal findings: Secondary | ICD-10-CM | POA: Diagnosis not present

## 2020-12-30 DIAGNOSIS — M9901 Segmental and somatic dysfunction of cervical region: Secondary | ICD-10-CM | POA: Diagnosis not present

## 2020-12-30 DIAGNOSIS — Z131 Encounter for screening for diabetes mellitus: Secondary | ICD-10-CM | POA: Diagnosis not present

## 2020-12-30 DIAGNOSIS — M9904 Segmental and somatic dysfunction of sacral region: Secondary | ICD-10-CM | POA: Diagnosis not present

## 2021-02-09 DIAGNOSIS — M9901 Segmental and somatic dysfunction of cervical region: Secondary | ICD-10-CM | POA: Diagnosis not present

## 2021-02-09 DIAGNOSIS — M9905 Segmental and somatic dysfunction of pelvic region: Secondary | ICD-10-CM | POA: Diagnosis not present

## 2021-02-09 DIAGNOSIS — M9904 Segmental and somatic dysfunction of sacral region: Secondary | ICD-10-CM | POA: Diagnosis not present

## 2021-02-09 DIAGNOSIS — M9903 Segmental and somatic dysfunction of lumbar region: Secondary | ICD-10-CM | POA: Diagnosis not present

## 2021-02-14 DIAGNOSIS — M9904 Segmental and somatic dysfunction of sacral region: Secondary | ICD-10-CM | POA: Diagnosis not present

## 2021-02-14 DIAGNOSIS — M9905 Segmental and somatic dysfunction of pelvic region: Secondary | ICD-10-CM | POA: Diagnosis not present

## 2021-02-14 DIAGNOSIS — M9901 Segmental and somatic dysfunction of cervical region: Secondary | ICD-10-CM | POA: Diagnosis not present

## 2021-02-14 DIAGNOSIS — M9903 Segmental and somatic dysfunction of lumbar region: Secondary | ICD-10-CM | POA: Diagnosis not present

## 2021-02-27 DIAGNOSIS — Z23 Encounter for immunization: Secondary | ICD-10-CM | POA: Diagnosis not present

## 2021-02-27 DIAGNOSIS — S61412A Laceration without foreign body of left hand, initial encounter: Secondary | ICD-10-CM | POA: Diagnosis not present

## 2021-02-27 DIAGNOSIS — W260XXA Contact with knife, initial encounter: Secondary | ICD-10-CM | POA: Diagnosis not present

## 2021-03-03 DIAGNOSIS — Z23 Encounter for immunization: Secondary | ICD-10-CM | POA: Diagnosis not present

## 2021-03-09 DIAGNOSIS — Z4802 Encounter for removal of sutures: Secondary | ICD-10-CM | POA: Diagnosis not present

## 2021-03-09 DIAGNOSIS — S61412A Laceration without foreign body of left hand, initial encounter: Secondary | ICD-10-CM | POA: Diagnosis not present

## 2021-03-23 DIAGNOSIS — M542 Cervicalgia: Secondary | ICD-10-CM | POA: Diagnosis not present

## 2021-03-23 DIAGNOSIS — M9901 Segmental and somatic dysfunction of cervical region: Secondary | ICD-10-CM | POA: Diagnosis not present

## 2021-03-23 DIAGNOSIS — M9903 Segmental and somatic dysfunction of lumbar region: Secondary | ICD-10-CM | POA: Diagnosis not present

## 2021-03-23 DIAGNOSIS — M9904 Segmental and somatic dysfunction of sacral region: Secondary | ICD-10-CM | POA: Diagnosis not present

## 2022-01-10 DIAGNOSIS — Z125 Encounter for screening for malignant neoplasm of prostate: Secondary | ICD-10-CM | POA: Diagnosis not present

## 2022-01-10 DIAGNOSIS — Z0000 Encounter for general adult medical examination without abnormal findings: Secondary | ICD-10-CM | POA: Diagnosis not present

## 2022-01-10 DIAGNOSIS — Z1329 Encounter for screening for other suspected endocrine disorder: Secondary | ICD-10-CM | POA: Diagnosis not present

## 2022-01-10 DIAGNOSIS — Z131 Encounter for screening for diabetes mellitus: Secondary | ICD-10-CM | POA: Diagnosis not present

## 2022-01-10 DIAGNOSIS — Z13220 Encounter for screening for lipoid disorders: Secondary | ICD-10-CM | POA: Diagnosis not present

## 2022-02-04 DIAGNOSIS — R0981 Nasal congestion: Secondary | ICD-10-CM | POA: Diagnosis not present

## 2022-02-04 DIAGNOSIS — Z20822 Contact with and (suspected) exposure to covid-19: Secondary | ICD-10-CM | POA: Diagnosis not present

## 2022-03-14 DIAGNOSIS — Z23 Encounter for immunization: Secondary | ICD-10-CM | POA: Diagnosis not present

## 2022-04-19 DIAGNOSIS — H6990 Unspecified Eustachian tube disorder, unspecified ear: Secondary | ICD-10-CM | POA: Diagnosis not present

## 2022-05-11 DIAGNOSIS — E7800 Pure hypercholesterolemia, unspecified: Secondary | ICD-10-CM | POA: Diagnosis not present

## 2022-05-11 DIAGNOSIS — R030 Elevated blood-pressure reading, without diagnosis of hypertension: Secondary | ICD-10-CM | POA: Diagnosis not present

## 2022-05-15 ENCOUNTER — Other Ambulatory Visit (HOSPITAL_BASED_OUTPATIENT_CLINIC_OR_DEPARTMENT_OTHER): Payer: Self-pay | Admitting: Family Medicine

## 2022-05-15 DIAGNOSIS — E7800 Pure hypercholesterolemia, unspecified: Secondary | ICD-10-CM

## 2022-05-31 ENCOUNTER — Ambulatory Visit (HOSPITAL_BASED_OUTPATIENT_CLINIC_OR_DEPARTMENT_OTHER)
Admission: RE | Admit: 2022-05-31 | Discharge: 2022-05-31 | Disposition: A | Discharge status: Home / Self Care | Payer: Self-pay | Source: Ambulatory Visit | Attending: Family Medicine | Admitting: Family Medicine

## 2022-05-31 DIAGNOSIS — E7800 Pure hypercholesterolemia, unspecified: Secondary | ICD-10-CM | POA: Insufficient documentation

## 2022-06-06 DIAGNOSIS — Z86010 Personal history of colonic polyps: Secondary | ICD-10-CM | POA: Diagnosis not present

## 2022-06-06 DIAGNOSIS — Z09 Encounter for follow-up examination after completed treatment for conditions other than malignant neoplasm: Secondary | ICD-10-CM | POA: Diagnosis not present

## 2022-06-06 DIAGNOSIS — K5730 Diverticulosis of large intestine without perforation or abscess without bleeding: Secondary | ICD-10-CM | POA: Diagnosis not present

## 2022-08-10 DIAGNOSIS — I2510 Atherosclerotic heart disease of native coronary artery without angina pectoris: Secondary | ICD-10-CM | POA: Diagnosis not present

## 2022-08-10 DIAGNOSIS — Z713 Dietary counseling and surveillance: Secondary | ICD-10-CM | POA: Diagnosis not present

## 2022-10-05 DIAGNOSIS — J309 Allergic rhinitis, unspecified: Secondary | ICD-10-CM | POA: Diagnosis not present

## 2022-10-05 DIAGNOSIS — J3489 Other specified disorders of nose and nasal sinuses: Secondary | ICD-10-CM | POA: Diagnosis not present

## 2022-11-13 DIAGNOSIS — J342 Deviated nasal septum: Secondary | ICD-10-CM | POA: Diagnosis not present

## 2022-11-30 DIAGNOSIS — J0100 Acute maxillary sinusitis, unspecified: Secondary | ICD-10-CM | POA: Diagnosis not present

## 2022-12-26 DIAGNOSIS — M9902 Segmental and somatic dysfunction of thoracic region: Secondary | ICD-10-CM | POA: Diagnosis not present

## 2022-12-26 DIAGNOSIS — M542 Cervicalgia: Secondary | ICD-10-CM | POA: Diagnosis not present

## 2022-12-26 DIAGNOSIS — M62838 Other muscle spasm: Secondary | ICD-10-CM | POA: Diagnosis not present

## 2022-12-26 DIAGNOSIS — M9901 Segmental and somatic dysfunction of cervical region: Secondary | ICD-10-CM | POA: Diagnosis not present

## 2023-01-26 DIAGNOSIS — Z125 Encounter for screening for malignant neoplasm of prostate: Secondary | ICD-10-CM | POA: Diagnosis not present

## 2023-01-26 DIAGNOSIS — Z0000 Encounter for general adult medical examination without abnormal findings: Secondary | ICD-10-CM | POA: Diagnosis not present

## 2023-01-26 DIAGNOSIS — N529 Male erectile dysfunction, unspecified: Secondary | ICD-10-CM | POA: Diagnosis not present

## 2023-01-26 DIAGNOSIS — Z13220 Encounter for screening for lipoid disorders: Secondary | ICD-10-CM | POA: Diagnosis not present

## 2023-03-08 DIAGNOSIS — M9901 Segmental and somatic dysfunction of cervical region: Secondary | ICD-10-CM | POA: Diagnosis not present

## 2023-03-08 DIAGNOSIS — M9904 Segmental and somatic dysfunction of sacral region: Secondary | ICD-10-CM | POA: Diagnosis not present

## 2023-03-08 DIAGNOSIS — M9050 Osteonecrosis in diseases classified elsewhere, unspecified site: Secondary | ICD-10-CM | POA: Diagnosis not present

## 2023-03-08 DIAGNOSIS — Z23 Encounter for immunization: Secondary | ICD-10-CM | POA: Diagnosis not present

## 2023-03-08 DIAGNOSIS — M9905 Segmental and somatic dysfunction of pelvic region: Secondary | ICD-10-CM | POA: Diagnosis not present

## 2023-10-19 DIAGNOSIS — M79672 Pain in left foot: Secondary | ICD-10-CM | POA: Diagnosis not present

## 2023-10-19 DIAGNOSIS — M25512 Pain in left shoulder: Secondary | ICD-10-CM | POA: Diagnosis not present

## 2024-01-30 DIAGNOSIS — Z23 Encounter for immunization: Secondary | ICD-10-CM | POA: Diagnosis not present

## 2024-01-30 DIAGNOSIS — Z0000 Encounter for general adult medical examination without abnormal findings: Secondary | ICD-10-CM | POA: Diagnosis not present

## 2024-04-03 DIAGNOSIS — Z23 Encounter for immunization: Secondary | ICD-10-CM | POA: Diagnosis not present
